# Patient Record
Sex: Female | Born: 1975 | Race: White | Hispanic: No | Marital: Married | Smoking: Current every day smoker
Health system: Southern US, Community
[De-identification: ages and names within clinical notes are randomized; demographics above are authoritative.]

## PROBLEM LIST (undated history)

## (undated) DIAGNOSIS — T4145XA Adverse effect of unspecified anesthetic, initial encounter: Secondary | ICD-10-CM

## (undated) DIAGNOSIS — Z8619 Personal history of other infectious and parasitic diseases: Secondary | ICD-10-CM

## (undated) DIAGNOSIS — T8131XA Disruption of external operation (surgical) wound, not elsewhere classified, initial encounter: Secondary | ICD-10-CM

## (undated) DIAGNOSIS — O24419 Gestational diabetes mellitus in pregnancy, unspecified control: Secondary | ICD-10-CM

## (undated) DIAGNOSIS — F439 Reaction to severe stress, unspecified: Secondary | ICD-10-CM

## (undated) DIAGNOSIS — R21 Rash and other nonspecific skin eruption: Secondary | ICD-10-CM

## (undated) DIAGNOSIS — IMO0002 Reserved for concepts with insufficient information to code with codable children: Secondary | ICD-10-CM

## (undated) DIAGNOSIS — Z658 Other specified problems related to psychosocial circumstances: Secondary | ICD-10-CM

## (undated) DIAGNOSIS — Z87891 Personal history of nicotine dependence: Secondary | ICD-10-CM

## (undated) DIAGNOSIS — Z8719 Personal history of other diseases of the digestive system: Secondary | ICD-10-CM

## (undated) DIAGNOSIS — Z87440 Personal history of urinary (tract) infections: Secondary | ICD-10-CM

## (undated) DIAGNOSIS — D649 Anemia, unspecified: Secondary | ICD-10-CM

## (undated) HISTORY — DX: Rash and other nonspecific skin eruption: R21

## (undated) HISTORY — DX: Personal history of other infectious and parasitic diseases: Z86.19

## (undated) HISTORY — DX: Reaction to severe stress, unspecified: F43.9

## (undated) HISTORY — DX: Adverse effect of unspecified anesthetic, initial encounter: T41.45XA

## (undated) HISTORY — DX: Other specified problems related to psychosocial circumstances: Z65.8

## (undated) HISTORY — DX: Personal history of urinary (tract) infections: Z87.440

## (undated) HISTORY — DX: Gestational diabetes mellitus in pregnancy, unspecified control: O24.419

## (undated) HISTORY — DX: Personal history of other diseases of the digestive system: Z87.19

## (undated) HISTORY — DX: Personal history of nicotine dependence: Z87.891

## (undated) HISTORY — DX: Reserved for concepts with insufficient information to code with codable children: IMO0002

## (undated) HISTORY — DX: Anemia, unspecified: D64.9

## (undated) HISTORY — PX: CHOLECYSTECTOMY: SHX55

## (undated) HISTORY — DX: Disruption of external operation (surgical) wound, not elsewhere classified, initial encounter: T81.31XA

---
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
## 1997-03-13 ENCOUNTER — Inpatient Hospital Stay (HOSPITAL_COMMUNITY): Admission: AD | Admit: 1997-03-13 | Discharge: 1997-03-14 | Payer: Self-pay | Admitting: Obstetrics

## 1997-03-16 ENCOUNTER — Inpatient Hospital Stay (HOSPITAL_COMMUNITY): Admission: AD | Admit: 1997-03-16 | Discharge: 1997-03-20 | Payer: Self-pay | Admitting: Obstetrics

## 1997-03-23 ENCOUNTER — Inpatient Hospital Stay (HOSPITAL_COMMUNITY): Admission: AD | Admit: 1997-03-23 | Discharge: 1997-03-23 | Payer: Self-pay | Admitting: Obstetrics

## 1997-05-21 ENCOUNTER — Emergency Department (HOSPITAL_COMMUNITY): Admission: EM | Admit: 1997-05-21 | Discharge: 1997-05-21 | Payer: Self-pay | Admitting: Emergency Medicine

## 1997-05-24 ENCOUNTER — Ambulatory Visit (HOSPITAL_COMMUNITY): Admission: RE | Admit: 1997-05-24 | Discharge: 1997-05-24 | Payer: Self-pay | Admitting: Obstetrics

## 1997-10-12 ENCOUNTER — Encounter: Payer: Self-pay | Admitting: Emergency Medicine

## 1997-10-12 ENCOUNTER — Emergency Department (HOSPITAL_COMMUNITY): Admission: EM | Admit: 1997-10-12 | Discharge: 1997-10-12 | Payer: Self-pay | Admitting: Emergency Medicine

## 2000-09-24 ENCOUNTER — Emergency Department (HOSPITAL_COMMUNITY): Admission: EM | Admit: 2000-09-24 | Discharge: 2000-09-24 | Payer: Self-pay | Admitting: Emergency Medicine

## 2001-05-09 ENCOUNTER — Other Ambulatory Visit: Admission: RE | Admit: 2001-05-09 | Discharge: 2001-05-09 | Payer: Self-pay | Admitting: Obstetrics and Gynecology

## 2001-11-21 ENCOUNTER — Inpatient Hospital Stay (HOSPITAL_COMMUNITY): Admission: AD | Admit: 2001-11-21 | Discharge: 2001-11-25 | Payer: Self-pay | Admitting: Obstetrics and Gynecology

## 2001-11-22 ENCOUNTER — Encounter (INDEPENDENT_AMBULATORY_CARE_PROVIDER_SITE_OTHER): Payer: Self-pay

## 2002-01-29 DIAGNOSIS — T8859XA Other complications of anesthesia, initial encounter: Secondary | ICD-10-CM

## 2002-01-29 HISTORY — DX: Other complications of anesthesia, initial encounter: T88.59XA

## 2002-05-11 ENCOUNTER — Other Ambulatory Visit: Admission: RE | Admit: 2002-05-11 | Discharge: 2002-05-11 | Payer: Self-pay | Admitting: Obstetrics and Gynecology

## 2002-05-29 ENCOUNTER — Encounter: Admission: RE | Admit: 2002-05-29 | Discharge: 2002-05-29 | Payer: Self-pay | Admitting: Family Medicine

## 2002-05-29 ENCOUNTER — Encounter: Payer: Self-pay | Admitting: Family Medicine

## 2004-10-17 ENCOUNTER — Other Ambulatory Visit: Admission: RE | Admit: 2004-10-17 | Discharge: 2004-10-17 | Payer: Self-pay | Admitting: Obstetrics and Gynecology

## 2004-11-29 DIAGNOSIS — R21 Rash and other nonspecific skin eruption: Secondary | ICD-10-CM

## 2004-11-29 HISTORY — DX: Rash and other nonspecific skin eruption: R21

## 2005-01-29 DIAGNOSIS — F439 Reaction to severe stress, unspecified: Secondary | ICD-10-CM

## 2005-01-29 HISTORY — DX: Reaction to severe stress, unspecified: F43.9

## 2005-10-24 ENCOUNTER — Other Ambulatory Visit: Admission: RE | Admit: 2005-10-24 | Discharge: 2005-10-24 | Payer: Self-pay | Admitting: Obstetrics and Gynecology

## 2006-01-29 DIAGNOSIS — Z8719 Personal history of other diseases of the digestive system: Secondary | ICD-10-CM

## 2006-01-29 HISTORY — DX: Personal history of other diseases of the digestive system: Z87.19

## 2006-06-06 ENCOUNTER — Encounter: Admission: RE | Admit: 2006-06-06 | Discharge: 2006-06-06 | Payer: Self-pay | Admitting: Obstetrics and Gynecology

## 2006-06-30 ENCOUNTER — Inpatient Hospital Stay (HOSPITAL_COMMUNITY): Admission: AD | Admit: 2006-06-30 | Discharge: 2006-06-30 | Payer: Self-pay | Admitting: Obstetrics and Gynecology

## 2006-07-15 ENCOUNTER — Inpatient Hospital Stay (HOSPITAL_COMMUNITY): Admission: AD | Admit: 2006-07-15 | Discharge: 2006-07-15 | Payer: Self-pay | Admitting: Obstetrics and Gynecology

## 2006-08-13 ENCOUNTER — Inpatient Hospital Stay (HOSPITAL_COMMUNITY): Admission: RE | Admit: 2006-08-13 | Discharge: 2006-08-16 | Payer: Self-pay | Admitting: Obstetrics and Gynecology

## 2006-08-13 ENCOUNTER — Encounter (INDEPENDENT_AMBULATORY_CARE_PROVIDER_SITE_OTHER): Payer: Self-pay | Admitting: Obstetrics and Gynecology

## 2007-11-11 ENCOUNTER — Encounter: Admission: RE | Admit: 2007-11-11 | Discharge: 2007-11-11 | Payer: Self-pay | Admitting: Obstetrics and Gynecology

## 2008-02-13 ENCOUNTER — Inpatient Hospital Stay (HOSPITAL_COMMUNITY): Admission: RE | Admit: 2008-02-13 | Discharge: 2008-02-15 | Payer: Self-pay | Admitting: Obstetrics and Gynecology

## 2008-02-13 ENCOUNTER — Encounter (INDEPENDENT_AMBULATORY_CARE_PROVIDER_SITE_OTHER): Payer: Self-pay | Admitting: Obstetrics and Gynecology

## 2010-05-15 LAB — BASIC METABOLIC PANEL
BUN: 7 mg/dL (ref 6–23)
CO2: 26 mEq/L (ref 19–32)
Calcium: 8.9 mg/dL (ref 8.4–10.5)
Chloride: 101 mEq/L (ref 96–112)
Creatinine, Ser: 0.46 mg/dL (ref 0.4–1.2)
GFR calc Af Amer: >60 mL/min (ref >60)
GFR calc non Af Amer: >60 mL/min (ref >60)
Glucose, Bld: 109 mg/dL — ABNORMAL HIGH (ref 70–99)
Potassium: 4 mEq/L (ref 3.5–5.1)
Sodium: 134 mEq/L — ABNORMAL LOW (ref 135–145)

## 2010-05-15 LAB — CBC
HCT: 35.7 % — ABNORMAL LOW (ref 36.0–46.0)
Hemoglobin: 11.8 g/dL — ABNORMAL LOW (ref 12.0–15.0)
Hemoglobin: 9.3 g/dL — ABNORMAL LOW (ref 12.0–15.0)
MCHC: 33 g/dL (ref 30.0–36.0)
MCV: 81.3 fL (ref 78.0–100.0)
MCV: 80.9 fL (ref 78.0–100.0)
Platelets: 225 10*3/uL (ref 150–400)
RBC: 4.39 MIL/uL (ref 3.87–5.11)
RBC: 3.41 MIL/uL — ABNORMAL LOW (ref 3.87–5.11)
RDW: 12.9 % (ref 11.5–15.5)
WBC: 11.4 10*3/uL — ABNORMAL HIGH (ref 4.0–10.5)
WBC: 11.7 10*3/uL — ABNORMAL HIGH (ref 4.0–10.5)

## 2010-05-15 LAB — RPR: RPR Ser Ql: NONREACTIVE

## 2010-05-15 LAB — GLUCOSE, CAPILLARY: Glucose-Capillary: 93 mg/dL (ref 70–99)

## 2010-06-13 NOTE — Discharge Summary (Signed)
[name], [name]           ACCOUNT NO.:  [id number]   MEDICAL RECORD NO.:  [id number]          PATIENT TYPE:  INP   LOCATION:  9128                          [facility]ILITY[facility]   PHYSICIAN:  Osborn Coho, M.D.   DATE OF BIRTH:  04-20-1975   DATE OF ADMISSION:  02/13/2008  DATE OF DISCHARGE:  02/15/2008                               DISCHARGE SUMMARY   [name] is a 35 year old G6, now P4-0-2-4, who was status post 2  days after her fourth cesarean at 53 weeks with the delivery of a fourth  daughter Miley 9 pounds 15 ounces, Apgars 9 at 1 minute and 9 at 5  minutes.   ADMISSION DIAGNOSES:  1. Spontaneous intrauterine pregnancy at 39 weeks.  2. History cesarean section x3.  3. Gestational diabetes mellitus insulin dependent.  4. Obesity.  5. Carpal tunnel syndrome.   DISCHARGE DIAGNOSES:  1. Status post term cesarean section #4 x2 days.  2. Gestational diabetes mellitus.  3. Obesity.  4. Carpal tunnel syndrome.  5. Mild anemia without symptoms.   PERTINENT LABORATORY DATA:  A+, rubella immune.  WBC 11.7 (11.4), HGB  9.3 (11.8), HCT 27.6 (35.7), PLT 177 (225), CBG on February 14, 2008, 93  mg/dL.   PROCEDURES:  1. Spinal anesthesia.  2. Repeat low transverse cesarean section.   PHYSICAL EXAMINATION:  VITAL SIGNS:  Stable.  The patient is afebrile.  LUNGS:  Clear to auscultation bilaterally.  HEART:  Rate regular rhythm.  No murmurs.  BREASTS:  Soft.  Nipples intact.  ABDOMEN:  Soft.  Fundus firm at the umbilicus, light lochia, mild  peripheral edema x4.  EXTREMITIES.  DTRs +2.  Negative Homan's and clonus x2.  The incision is  edematous around the pannus with diffuse bruising around the incision.  The wound remains well approximated and the Steri-Strips are intact.  There is no exudate.   DISCHARGE MEDICATIONS:  1. Prenatal vitamin, continue  2. Integra F b.i.d., prescription given.  3. Motrin 600 mg, prescription given.  4. Percocet 5/325, prescription  given.  5. Hydrochlorothiazide 12.5 mg 1 p.o. daily x7 days, prescription      given.   Postpartum booklet was reviewed with the patient, especially danger  signs postoperatively and postpartum.  The patient is to follow up in  the office at 6 weeks postpartum and will have a repeat glucose  challenge test after the postpartum period.  [name] is deemed to  have received full benefit of her hospitalization for the delivery of  her fourth daughter and she was offered an additional stay and declined.      Eulogio Bear, CNM      Osborn Coho, M.D.  Electronically Signed    JM/MEDQ  D:  02/15/2008  T:  02/16/2008  Job:  [id number]

## 2010-06-13 NOTE — Op Note (Signed)
NAMEGRISELA, [name]           ACCOUNT NO.:  [id number]   MEDICAL RECORD NO.:  [id number]          PATIENT TYPE:  INP   LOCATION:  9128                          [facility]   PHYSICIAN:  Osborn Coho, M.D.   DATE OF BIRTH:  05-21-1975   DATE OF PROCEDURE:  02/13/2008  DATE OF DISCHARGE:                               OPERATIVE REPORT   PREOPERATIVE DIAGNOSES:  1. A 39-plus weeks.  2. Repeat cesarean section.   POSTOPERATIVE DIAGNOSES:  1. A 39-plus weeks.  2. Repeat cesarean section.   PROCEDURE:  Repeat cesarean section.   ATTENDING DOCTOR:  Osborn Coho, MD   ASSISTANT:  Missy Sabins, CNM   ANESTHESIA:  Spinal.   Viable female infant with Apgars of 9 at one minute and 9 at five  minutes, Mylie, weighing 9 pounds 15 ounces.   SPECIMENS TO PATHOLOGY:  Placenta.   FLUIDS:  2800 mL.   URINE OUTPUT:  300 mL.   EBL:  700 mL.   COMPLICATIONS:  None.   PROCEDURE:  The patient was taken to the operating room after risks,  benefits, and alternatives were discussed with the patient.  The patient  verbalized understanding and consent signed and witnessed.  The patient  was given a spinal per Anesthesia and prepped and draped in the normal  sterile fashion in a dorsal supine position.  A Pfannenstiel skin  incision was made and carried down to the underlying layer of fascia  with a scalpel and a Bovie.  The fascia was excised bilaterally and  midline extended bilaterally with the Mayo scissors.  Kocher clamps were  placed on the inferior aspect of the fascial incision and the rectus  muscle was dissected from the fascia.  The same was done on the superior  aspect of the fascial incision.  The muscle was separated in the midline  and the peritoneum entered bluntly and extended manually.  The bladder  blade was placed and bladder flap was created with the Metzenbaum  scissors.  The uterine incision was made with a scalpel and extended  bilaterally with a bandage  scissors.  The membranes were bulging and  ruptured manually and clear fluid noted.  The infant was delivered in  the vertex presentation without any difficulty.  The cord was clamped  and cut and the infant handed to the waiting pediatricians.  The  placenta was removed and upon removal uterine everted.  The uterus was  easily replaced and at this point it was exteriorized and the placenta  was sent to Pathology.  The uterus was cleared of all clots and debris.  The uterine incision was repaired with 0 Vicryl via a running  interlocking stitch and a second imbricating layer was performed.  Normal bilateral ovaries and fallopian tubes were noted.  The intra-  abdominal cavity was copiously irrigated.  The uterus was returned to  the intra-abdominal cavity and uterine incision was noted to be  hemostatic.  The peritoneum was repaired with 2-0 chromic via a running  stitch.  The fascia was repaired with 0 Vicryl via a running stitch.  The subcutaneous tissue was  reapproximated using 3 interrupted stitches  of 2-0 plain.  The skin was reapproximated using 3-0 Monocryl.  Half-  inch Steri-Strips were applied with Benzoin.  Dressing was applied as  well.  Sponge, lap, and needle count was correct.  The patient tolerated  the procedure well and is currently being transferred to the recovery  room in good condition.      Osborn Coho, M.D.  Electronically Signed     AR/MEDQ  D:  02/13/2008  T:  02/14/2008  Job:  [id number]

## 2010-06-13 NOTE — H&P (Signed)
[name], [name]           ACCOUNT NO.:  [id number]   MEDICAL RECORD NO.:  [id number]          PATIENT TYPE:  MAT   LOCATION:  MATC                          [facility]   PHYSICIAN:  Osborn Coho, M.D.   DATE OF BIRTH:  March 23, 1975   DATE OF ADMISSION:  02/13/2008  DATE OF DISCHARGE:                              HISTORY & PHYSICAL   Ms. Ossa is a 35 year old.  She is a married white female gravida 6,  para 3-0-2-3 who presents on the day of admission for a scheduled repeat  low transverse cesarean section.  She is 39-2/7 weeks per an [facility] of  February 18, 2008.  She is without complaints this morning with the  exception of some slight nausea and a little bit of dry heaves when she  was brushing her teeth this morning.  She, otherwise, feels good.  Good  fetal movements with some occasional contractions.  She denies any PIH  or UTI signs or symptoms, no fever, cough or shortness of breath.  She  has been followed by Dr. Su Hilt at [facility].  Her history has been  remarkable for:   1. Gestational diabetes, insulin-dependency.  She regularly takes      Humulin 8 units every morning subcu, and then has 22 units subcu at      night.  Per anesthesia she took 11 units last night since she is      going to be n.p.o., and she has not had any this morning.  2. Obesity.  3. Previous C-section x3 with desire for repeat.  Her group beta strep      is negative.  Other medications in addition to her insulin is just      a prenatal vitamin.   ALLERGIES:  She denies medication or latex allergies and no other  sensitivities.   MENSTRUAL HISTORY:  She reports menarche at age 14, monthly cycles.  Reports no abnormalities, but had an uncertain LMP.  She said it was the  1st of April so her [facility] of January 20th was based on an ultrasound at 12  weeks.   OBSTETRICAL HISTORY:  Rhett Bannister 1 was a C-section for chorioamnionitis 42  weeks' gestation.  That was in February of 1999.  It was a  female.  Weighed 8 pounds 1 ounce after about 16 hours of labor.  She did have an  epidural, and that was by Dr. Gaynell Face. Rhett Bannister 2 was an elective  abortion around 6 weeks, and that was May 2001.  Gravida 3 was a failed  VBAC leading to a repeat C-section at 41 weeks' gestation.  A female in  October of 2003 weighing 8 pounds 10 ounces after about 16 hours of  labor as well.  She did have an epidural, and that was by Larene Beach, Dr.  Su Hilt, and Dr. Stefano Gaul.  Gravida 4 was a spontaneous abortion in  2006.  Gravida 5 was a repeat C-section at 57 and 2, a female July 2008  weighing 8 pounds 5 ounces; spinal that was by Dr. Su Hilt and Larene Beach,  and she did have gestational diabetes requiring insulin. Gravida 6  is  current pregnancy.   PAST MEDICAL HISTORY:  She has a history of anemia.  She has used Depo  in the past for birth control.  Her last shot was in November of 2008.  She did have varicella as a child, normal childhood illnesses.  Treated  for chlamydia in the 1990s.   SURGICAL HISTORY:  Remarkable for the C-section x3, and she has had a  cholecystectomy in 2004.   FAMILY HISTORY:  Maternal grandmother with heart disease as well as high  blood pressure and stroke.  Maternal grandmother COPD and an uncle with  lung cancer.   GENETIC HISTORY:  Unremarkable.   SOCIAL HISTORY:  She is a married white female.  She reports [employer].  The father of the baby's name is Musa Cotta.  The patient has  a GED.  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  She denied alcohol,  tobacco or illicit drug use.   HISTORY OF PRESENT PREGNANCY:  Prenatal labs:  Blood type is A+, Rh  antibody screen negative, RPR nonreactive, rubella titer immune.  Hepatitis surface antigen negative, HIV nonreactive.  Hemoglobin 12,  hematocrit was 36.3, and this was all July 6th.  Platelets 288.  Her Pap  in September of 2008 was within  normal limits.  Gonorrhea and Chlamydia  cultures negative.  Cystic fibrosis screen in January of 2008 was  negative.  She entered care July the 6th with unsure dates.  Had an  ultrasound for dating around 12 weeks.  She reported a pregravid weight  of 185.  She is 5 feet 6 inches.  At her new OB workup  she did plan  care with Dr. Su Hilt, and did plan for a repeat C-section.  Her weight  at that time was 202.  She did return at 13 weeks.  Was spilling some  glucose 2+; her dextrose sugar was 144.  She had eaten about 2-3 hours  before that, had some peach cobbler and sweet tea.  Had her 1st  trimester screen done that day.  Nuchal translucency was 1.85 mm.  Nasal  bone was seen.  That first trimester screen was negative.  Did have  bilateral carpal tunnel syndrome during the pregnancy.  She did  initially have some desire for bilateral tubal ligation, but this  morning does not want to get her tubes tied per her verbalization.  Did  have AFP done at 16-1/7 weeks, and it was also within normal limits.  She had her anatomy ultrasound at 18-5/7 weeks.  Single intrauterine  pregnancy size was equal to previous dating.  Cervix was 4.9 cm.  Posterior placenta was normal, slightly low lying, and AFI was within  normal limits.  Anatomy and growth and development were normal;  suggested another female.  By the 22-week mark she had gained  approximately 24 pounds, 10 pounds the previous months, and plan was for  1-hour GTT.  It was abnormal.  She then went on to have 3-hour GTT, and  3/4 were abnormal values and was diagnosed with gestational diabetes.  She was referred to [facility] for insulin teaching and her finger sticks at that time.  She did  have to have some cortisone shots during the pregnancy for her hands.  The patient did receive her flu shot.  She had an ultrasound at 28  weeks.  She was measuring greater than dates.  She was measuring  at  fundal height is 32.  Ultrasound showed an estimated fetal weight in the  80th percentile at that time.  Normal AFI and placenta were no longer  low-lying, and the cervix was 4.5 cm.  During the pregnancy initially  she was placed on NPH, and eventually switched to the Humulin.  Did have  some complaints of GERD.  She had an ultrasound at 32 in size, and  baby's weight was measuring greater than 90th percentile, normal fluid,  posterior placenta.  She was started on antenatal testing around 32  weeks, and pregnancy continued to progress without any other significant  complications until her presentation today for her repeat C-section.   OBJECTIVE:  VITAL SIGNS:  She is afebrile, and vital signs are stable on  admission.  GENERAL:  She is alert and oriented x3.  No acute distress.  HEENT:  Grossly intact and within normal limits.  She denies having  glasses or contacts.  CARDIOVASCULAR:  Regular rate and rhythm without murmur.  LUNGS:  Clear to auscultation bilaterally.  ABDOMEN:  Soft, nontender, and gravid.  PELVIC:  Deferred.  EXTREMITIES:  Without edema and no clonus, DTRs are 2+.   IMPRESSION:  1. Intrauterine pregnancy at 39-2/7 weeks.  2. Previous cesarean section x3 with desire for repeat.  3. Gestational diabetes insulin-dependent.  4. Group B streptococcus is negative.  5. Obesity.   PLAN:  1. She was admitted to short stay with routine preoperative orders.  2. Dr. Su Hilt will be by to speak with patient before surgery, and      will defer any questions to her and plan admission under Dr.      Su Hilt.      Candice [place], [place]      Osborn Coho, M.D.  Electronically Signed    CHS/MEDQ  D:  02/13/2008  T:  02/13/2008  Job:  [id number]

## 2010-06-16 NOTE — H&P (Signed)
[name], [name]           ACCOUNT NO.:  [id number]   MEDICAL RECORD NO.:  [id number]          PATIENT TYPE:  INP   LOCATION:  NA                            [facility]   PHYSICIAN:  Osborn Coho, M.D.   DATE OF BIRTH:  [birth year]   DATE OF ADMISSION:  08/13/2006  DATE OF DISCHARGE:                              HISTORY & PHYSICAL   [name] is a 35 year old gravida 5, para 2-0-1-2   Dictation ended at this point.      Renaldo Reel Emilee Hero, C.N.M.      Osborn Coho, M.D.     VLL/MEDQ  D:  08/12/2006  T:  08/12/2006  Job:  [id number]

## 2010-06-16 NOTE — Discharge Summary (Signed)
NAMEANNALEI, [name]           ACCOUNT NO.:  [id number]   MEDICAL RECORD NO.:  [id number]          PATIENT TYPE:  MAT   LOCATION:  MATC                          [facility]   PHYSICIAN:  Janine Limbo, M.D.DATE OF BIRTH:  1975/07/12   DATE OF ADMISSION:  08/13/2006  DATE OF DISCHARGE:  08/16/2006                               DISCHARGE SUMMARY   ADMITTING DIAGNOSES:  1. Intrauterine pregnancy at 39 weeks.  2. Previous cesarean section x2 with desire for repeat.  3. Declines tubal.  4. Gestational diabetes on 4 units of insulin.   DISCHARGE DIAGNOSES:  1. Intrauterine pregnancy at 39 weeks.  2. Previous cesarean section x2 with desire for repeat.  3. Declines tubal.  4. Gestational diabetes on 4 units of insulin.  5. Uterine inversion with replacement.   PROCEDURES:  1. Repeat low transverse cesarean section.  2. Spinal anesthesia.   HOSPITAL COURSE:  [name] is a 35 year old gravida 5, para 2-0-2-2  at 25 weeks who presented for scheduled repeat cesarean section on August 13, 2006.  Her history had been remarkable for:  1. Previous C-section x2 with desire for repeat.  2. History of wound dehiscence after one of her last C-section.  3. Needle stick in December 30, 2005.  4. Gestational diabetes managed on 4 units of insulin at bedtime.   The patient was taken to the operating room by Dr. Osborn Coho where  a repeat low transverse cesarean section was performed under spinal  anesthesia.  Findings were a viable female, weight 8 pounds 5 ounces.  Apgars were 9 and 9.  Complicating factors were a uterine inversion with  a replacement manually.  Placenta was sent to pathology.  The patient  tolerated the procedure well was taken to recovery in good condition.  Infant was taken to the full-term nursery.  By postop day #1, patient  was doing well.  Her incision was clean, dry and intact.  Her JP drain  was draining serosanguineous fluid.  Her hemoglobin was 9.8  down from  12.8 and hematocrit 28.9, white blood cell count 12.5 and platelet count  199.  Lactation consultant saw the patient for sore nipples.  Rest of  the patient's hospital course was uncomplicated.  By postop day #3, she  was doing well.  She was pumping for breast milk.  She had removal of  her JP drain without difficulty.  Her lochia was scant.  Her incision  was clean, dry and intact.  She was tolerating a regular diet and was  taking p.o. pain medication with benefit.  She was deemed to receive  full benefit of hospital stay and was discharged home.   DISCHARGE CONDITION:  Stable.   DISCHARGE INSTRUCTIONS:  Per [facility] handout.   DISCHARGE MEDICATIONS:  1. Motrin 600 mg p.o. q.6 h p.r.n. pain.  2. Percocet 5/325 one to two p.o. daily first p.r.n. pain.   The patient was currently undecided about her birth control.      Renaldo Reel Emilee Hero, C.N.M.      Janine Limbo, M.D.  Electronically Signed  VLL/MEDQ  D:  08/16/2006  T:  08/16/2006  Job:  [id number]

## 2010-06-16 NOTE — H&P (Signed)
[name], [name]                       ACCOUNT NO.:  [id number]   MEDICAL RECORD NO.:  [id number]                   PATIENT TYPE:  INP   LOCATION:  9166                                 [facility]   PHYSICIAN:  Osborn Coho, M.D.                DATE OF BIRTH:  February 22, 1975   DATE OF ADMISSION:  11/21/2001  DATE OF DISCHARGE:                                HISTORY & PHYSICAL   HISTORY OF PRESENT ILLNESS:  The patient is a 35 year old married white  female gravida 3, para 1-0-1-1 at 40-5/7 weeks who presents complaining of  uterine contractions every 3-4 minutes for the last 1-1/2 hours.  She has  seen some bloody show.  She denies any leaking of fluid.  She denies any  headache, nausea, vomiting, or visual disturbances.  She reports positive  fetal movement.  Her pregnancy has been followed at [facility]  by the certified nurse midwife service and has been at risk for a history of  previous low transverse cesarean section for nonreassuring fetal heart rate  and maternal fever and she is desiring a vaginal birth after cesarean  section attempt.  Her second complication with pregnancy was unknown LMP and  third was history of wound dehiscence following last delivery.  Her group B  strep is negative.   OBSTETRICAL-GYNECOLOGICAL HISTORY:  She is a gravida 3, para 1-0-1-1.  She  had a C-section delivery in February 1999 of a viable female infant who  weighed 8 pounds 1 ounce at [redacted] weeks gestation following a 10-hour labor.  She progressed to 7 cm but developed a fever and the fetal heart rate was  nonreassuring at which point she was recommended to proceed with a cesarean  section and underwent the same.  It is reported and documented to be a low  transverse cesarean section.  In May 2001 she had an elective AB with no  complications.  She was told that she was a borderline gestational diabetic  with her last pregnancy but never officially diagnosed.  She was  treated for  gonorrhea in 1996.   GENERAL MEDICAL HISTORY:  She has no known drug allergies.  She reports  having had the usual childhood diseases.  She has actually a pretty benign  medical history.   PAST SURGICAL HISTORY:  Her only surgical history was for elective AB in  2001 and the C-section in 1999.   FAMILY HISTORY:  Father and mother with hypertension.  Mother with a heart  murmur.  Paternal uncle with lung cancer.  Mother with depression.   GENETIC HISTORY:  Negative.   SOCIAL HISTORY:  She is married to [employer].  She is of the [employer].  She denies any illicit drug use, alcohol, or smoking with this  pregnancy.  She quit smoking as soon as she knew she was pregnant.   PRENATAL LABORATORIES:  Her blood type is A  positive, her antibody screen is  negative, syphilis is nonreactive, rubella is immune, hepatitis B surface  antigen is negative, HIV is declined, GC and Chlamydia are both negative,  Pap smear is within normal limits, 1-hour glucola was 105, maternal serum  alpha-fetoprotein was within normal range and her 36-week beta strep was  negative.   PHYSICAL EXAMINATION:  VITAL SIGNS: Stable.  She is afebrile.  HEENT: Grossly within normal limits.  HEART: Regular rhythm and rate.  CHEST: Clear.  BREASTS: Soft and nontender.  ABDOMEN: Gravid with uterine contractions every 3-4 minutes for 30-40  seconds, fetal heart rate is with a baseline of around 160, she did have a  10-beat per minute accel with scalp stimulation and has had mild variables.  CERVIX: Cervix is 3 cm, 90%, vertex, -1 with bulging membranes.  EXTREMITIES: Within normal limits.   ASSESSMENT:  1. Intrauterine pregnancy at 40-5/7 weeks.  2. Early labor.  3. Previous low transverse cesarean section desiring vaginal birth after     cesarean and signing the vaginal birth after cesarean consent form.  4. Negative group B streptococci.   PLAN:  Admit to labor and delivery to follow routine  CNM orders.  Dr.  Su Hilt has been notified of the patient's admission.     Concha Pyo. [place], C.N.M.              Osborn Coho, M.D.    SJD/MEDQ  D:  11/21/2001  T:  11/21/2001  Job:  [id number]

## 2010-06-16 NOTE — H&P (Signed)
[name], [name]           ACCOUNT NO.:  [id number]   MEDICAL RECORD NO.:  [id number]          PATIENT TYPE:  INP   LOCATION:  NA                            [facility]   PHYSICIAN:  Osborn Coho, M.D.   DATE OF BIRTH:  03-07-1975   DATE OF ADMISSION:  08/13/2006  DATE OF DISCHARGE:                              HISTORY & PHYSICAL   HISTORY OF PRESENT ILLNESS:  Ms. Coverdale is a 35 year old gravida 5,  para 2-0-2-2, at 39 weeks, who presents for scheduled repeat cesarean  section.   PAST MEDICAL HISTORY:  1. Her history is remarkable for previous C-section x2.  2. History of wound dehiscence after one of her last C-sections.  3. Needle stick in December 2007.  4. Gestational diabetes.   PRENATAL LABS:  Blood type is A positive, RH antibody negative, VDRL  nonreactive, Rubella titer positive, hepatitis B surface antigen  negative.  HIV nonreactive.  GC and Chlamydia cultures were negative in  September as well as at 35 weeks.  Cystic fibrosis testing was negative.  Pap was normal also in September.  Hemoglobin upon entering the practice  was 12.7.  It was normal at 28 weeks.  The patient had normal first  trimester screening.  She had a normal AFP.  She had an elevated 1-hour  GTT, Glucola at 140, and an abnormal 3-hour GTT.  Group B strep culture  and other cultures were negative at 36 weeks.   HISTORY OF PRESENT PREGNANCY:  The patient entered care at approximately  12 weeks.  She planned a repeat C-section with Dr. Su Hilt.  She had a  normal first trimester screen and a normal AFP.  She had an ultrasound  at 18 weeks.  She also had had a first trimester ultrasound for dating.  She had another ultrasound at 18 weeks showing normal growth.  She was  tearful at an office visit at 21-22 weeks but she did not share the  issue there.  She had a Glucola of 140 and then an abnormal 3-hour GTT  for the diagnosis of gestational diabetes.  Initially, sugars were  fairly  normal, however, by 30-31 weeks, she was having some slight  elevations in both fasting and 2 hours.  She then was started on 4 units  of NPH at bedtime.  She began to see [place] twice weekly at 32 weeks.  There  were some issues with compliance.  She had an ultrasound at 33 weeks  showing growth at the 74th percentile, normal fluid, a BPP of 6 out of  8.  NSTs were reassuring but nonreactive.  She was seen at maternity  admissions unit several times through the course of her pregnancy for  various issues.  NSTs were continued.  She was then scheduled for C-  section on August 13, 2006.   OBSTETRICAL HISTORY:  In 1998, she had a primary low transverse cesarean  section for a female infant, weight 8 pounds 1 ounce, at 42 weeks.  She  was in labor 16 hours.  She had epidural anesthesia.  She had a C-  section for chorioamnionitis.  In 2001, she had a 6-week termination.  In 2003, she had a repeat low transverse cesarean section for failed  VBAC.  She had a female infant, weight 8 pounds, 10 ounces at 41 weeks.  She was in labor 16 hours.  She had epidural anesthesia.  She did have  anemia during that pregnancy.  She also had wound dehiscence following  that delivery.   PAST MEDICAL HISTORY:  She is a previous Depo user.  She had gonorrhea  in 1993.  Reports usual childhood illnesses.  She was stuck by a dirty  needle while caring for an elderly diabetic female on December 30, 2005.  She has a history of anemia.   PAST SURGICAL HISTORY:  Includes the previously noted C-section x2 and a  cholecystectomy in 2004 or 2005.   ALLERGIES:  She has no known medication allergies.   FAMILY HISTORY:  Maternal grandmother had hypertension, heart disease,  COPD, diabetes, and a stroke.  Her uncle had lung cancer.  There is a  family history of smoking.   GENETIC HISTORY:  Unremarkable.   SOCIAL HISTORY:  The patient is married to the father of the baby.  He  is involved and supportive.  His name is  Catering manager.  The patient  has her GED.  She is employed as a Education administrator.  Her  husband had a high school education.  He is employed as a Naval architect.  She has been followed by the physician service __________ [facility].  She denies any alcohol, drug, or tobacco use during this pregnancy.   PHYSICAL EXAMINATION:  VITAL SIGNS:  Stable.  The patient is afebrile.  HEENT:  Within normal limits.  LUNGS:  Breath sounds are clear.  HEART:  Regular rate and rhythm without murmur.  BREASTS:  Soft and nontender.  ABDOMEN:  Fundal height is approximately 38 cm.  Estimated fetal weight  is 8 to 8-1/2 pounds.  Uterine contractions are occasionally mild per  the patient's report.  PELVIC:  Exam is deferred.  EXTREMITIES:  Deep tendon reflexes are 2+ without clonus.  There is  trace edema noted.   IMPRESSION:  1. Intrauterine pregnancy at 39 weeks.  2. Previous cesarean section times 2 with desire for repeat.  3. Declines tubal.  4. Gestational diabetes on 4 units of insulin nightly.   PLAN:  1. Admit to the [facility] for consult with Dr.      Osborn Coho, the attending physician.  2. Routine physician preoperative orders.  3. Dr. Su Hilt will define plan for monitoring of her diabetes.      Renaldo Reel Emilee Hero, C.N.M.      Osborn Coho, M.D.  Electronically Signed    VLL/MEDQ  D:  08/12/2006  T:  08/12/2006  Job:  [id number]

## 2010-06-16 NOTE — Discharge Summary (Signed)
[name], [name]                       ACCOUNT NO.:  [id number]   MEDICAL RECORD NO.:  [id number]                   PATIENT TYPE:  INP   LOCATION:  9145                                 [facility]ILITY:  [facility]   PHYSICIAN:  Naima A. Dillard, M.D.              DATE OF BIRTH:  Mar 08, 1975   DATE OF ADMISSION:  11/21/2001  DATE OF DISCHARGE:  11/25/2001                                 DISCHARGE SUMMARY   ADMITTING DIAGNOSES:  1. Intrauterine pregnancy at 40 and 5/7 weeks.  2. Early labor.  3. Previous low transverse cesarean section desiring vaginal birth after     cesarean.   DISCHARGE DIAGNOSES:  1. Term intrauterine pregnancy.  2. Vaginal birth after cesarean attempt, but unsuccessful.  3. Chorioamnionitis.  4. Nonreassuring fetal heart rate.  5. History of previous wound dehiscence with last pregnancy.   HOSPITAL COURSE:  The patient is a 35 year old gravida 3, para 1-0-1-1 at 12  and 5/7 weeks who was admitted in early labor on November 21, 2001.  Her  history had been remarkable for previous low transverse cesarean section for  nonreassuring fetal heart rate, maternal fever with a desire for a vaginal  birth, questionable LMP, prior history of wound dehiscence following her  last delivery.  On admission she was 3 cm, 90%, vertex at a -1 station.  She  was admitted for labor care.  Artificial rupture of membranes was  accomplished soon after admission and she had essentially clear fluid but  there were some small fragments of meconium.  Fetal heart rate was  reassuring.  She was placed on penicillin for elevated temperature.  Early  on the morning of October 25 she began to have episodes of tachycardia.  Cervix was 6 with edematous anterior lip.  Vertex was at a 0/+1 station.  The patient had had an epidural placed.  Fetal heart rate tended to show  some decelerations when the patient was on the left side, but was reassuring  when she was on the right.  Through the rest of early  morning Pitocin was  given in an attempt to accomplish adequate labor.  By 6 a.m. on the morning  of October 25 she was 8 cm, vertex at +1 station.  Fetal heart was in the  160s-170s.  Temperature was 101.2.  Temperature one hour later was 101.9  with a fetal heart rate increasing in tachycardia to 170-175 and minimal  variability.  WBC count had been noted to be 28.5 on admission.  The patient  had been placed on ampicillin and gentamicin prior to that time.  She was  given Tylenol.  Over the next 30 minutes the fetal heart rate began to show  increasing tachycardia to 195.  Cervix was approximately 9 cm with an  asynclitic presentation.  There was no benefit to her temperature from  several doses of Tylenol.  Therefore, the patient was consented for a repeat  cesarean section secondary to nonreassuring fetal heart rate and  chorioamnionitis.  The patient was taken to the operating room where Dr.  Osborn Coho and Dr. Marline Backbone performed a low transverse cesarean  section under epidural anesthesia.  Findings were a viable female with  Apgars of 4, 8, and 9.  Cord pH was 7.16.  The infant was taken to the full-  term nursery.  Mother was taken to recovery room in good condition.  She did  have mild temperature post delivery, but then defervesced over the next  several hours.  She was planning Depo Provera.  Her hemoglobin on day one  was 9.3, WBC count 25.2 down from 28.5.  Antibiotics were continued with  gentamicin and clindamycin until she was afebrile for 24 hours.  Lactation  was consulted to assist the patient in her breast-feeding attempts.  Her WBC  count on day two was 15.1 and she had been afebrile for 24 hours.  By  postoperative day three patient was up at lib.  She was having some general  incisional soreness, but overall was doing well.  She was afebrile.  Her  incision was clean, dry, and intact.  Physical examination was within normal  limits.  Her consultation with  Dr. Dois Davenport Rivard on day two the decision was  made to maintain her staples until approximately six days postoperative.  The patient was understanding of this plan and was discharged home.   DISCHARGE INSTRUCTIONS:  Per [facility] handout.   DISCHARGE MEDICATIONS:  1. Motrin 600 mg p.o. q.6h. p.r.n. pain.  2. Tylox one to two p.o. q.3-4h. p.r.n. pain.   DISCHARGE FOLLOWUP:  Will occur on November 29, 2001 for staple removal at  2:30 and then at six weeks postpartum.  Wound care precautions were reviewed  again with the patient in light of her previous history of wound dehiscence.  The patient is to call with any concerns.     [name], C.N.M.                   Naima A. Normand Sloop, M.D.    [name]  D:  11/25/2001  T:  11/26/2001  Job:  [id number]

## 2010-06-16 NOTE — Op Note (Signed)
NAMEBRAXTYN, DORFF                       ACCOUNT NO.:  [id number]   MEDICAL RECORD NO.:  [id number]                   PATIENT TYPE:  INP   LOCATION:  9166                                 [facility]   PHYSICIAN:  Osborn Coho, M.D.                DATE OF BIRTH:  1975/04/29   DATE OF PROCEDURE:  11/22/2001  DATE OF DISCHARGE:                                 OPERATIVE REPORT   PREOPERATIVE DIAGNOSES:  1. Term intrauterine pregnancy.  2. Vaginal birth after cesarean (VBAC).  3. Chorioamnionitis.  4. Nonreassuring fetal heart tracing.   POSTOPERATIVE DIAGNOSES:  1. Term intrauterine pregnancy.  2. Vaginal birth after cesarean (VBAC).  3. Chorioamnionitis.  4. Nonreassuring fetal heart tracing.   PROCEDURE:  Repeat low transverse cesarean section.   SURGEON:  Osborn Coho, M.D.   ASSISTANT:  Janine Limbo, M.D.   ANESTHESIA:  Epidural.   FLUIDS REPLACED:  2000 cc.   URINE OUTPUT:  200 cc.   ESTIMATED BLOOD LOSS:  500 cc.   COMPLICATIONS:  None.   FINDINGS:  Live female infant with Apgars of 4 at one minute, 8 at five  minutes, and 9 at 10 minutes.  Normal bilateral ovaries and tubes.  Cord pH  7.16.  Placenta sent to pathology.   DESCRIPTION OF PROCEDURE:  The patient was taken to the operating room after  the risks, benefits, and alternatives of a C-section were discussed with the  patient and partner.  The patient verbalized understanding and consent for C-  section signed and witnessed.  The patient was given epidural bolus to  acquire a surgical level.  The patient was prepped and draped in the normal  sterile fashion.  A Pfannenstiel skin incision was made at the site of the  prior C-section scar and was carried down to the underlying layer of fascia  with the knife.  The remaining portion of the fascia was bluntly dissected.  The muscle was separated in the midline and the peritoneum entered bluntly.  Bladder flap created with the knife and  bladder blade used to protect the  bladder.  A uterine incision was made and extended manually.  The infant was  in the vertex presentation and delivered without difficulty.  The infant's  nasopharynx and oropharynx were bulb-suctioned and then immediately handed  off to the awaiting pediatricians.  Cord bloods were sent.  A cord gas was  sent as well.  The placenta was manually removed and sent to pathology.  The  uterine incision was repaired with 0 Vicryl in a running locked fashion.  A  second imbricating layer was performed.  The fascia was repaired with 0  Vicryl after the gutters were cleared of all clots and debris and normal  bilateral ovaries and tubes noted.  The subcutaneous layer was closed with 3-  0 plain using interrupteds.  The skin was closed with staples.  The patient  tolerated  the procedure well and was returned to the recovery room in stable  condition.                                               Osborn Coho, M.D.    AR/MEDQ  D:  11/22/2001  T:  11/24/2001  Job:  [id number]

## 2010-06-16 NOTE — Op Note (Signed)
[name], [name]           ACCOUNT NO.:  [id number]   MEDICAL RECORD NO.:  [id number]          PATIENT TYPE:  INP   LOCATION:  9120                          [facility]   PHYSICIAN:  Osborn Coho, M.D.   DATE OF BIRTH:  November 10, 1975   DATE OF PROCEDURE:  08/13/2006  DATE OF DISCHARGE:                               OPERATIVE REPORT   PREOPERATIVE DIAGNOSIS:  1. 39 weeks.  2. Repeat C-section.   POSTOPERATIVE DIAGNOSIS:  1. 39 weeks.  2. Repeat C-section.  3. Uterine inversion with replacement.   PROCEDURE:  Repeat C-section.   ATTENDING:  Osborn Coho, M.D.   INTRAOPERATIVE CONSULTS:  Maxie Better, M.D.   ASSISTANT:  Renaldo Reel. Emilee Hero, C.N.M.   ANESTHESIA:  Spinal.   SPECIMEN TO PATHOLOGY:  Placenta.   FLUIDS:  3050 mL   ESTIMATED BLOOD LOSS:  800 mL   URINE OUTPUT:  125 mL   COMPLICATIONS:  None.   FINDINGS:  Live female infant with Apgars of 9 at 1 minute and 9 at 5  minutes.  Weight 8 pounds 5 ounces.  Name Keely, normal-appearing  bilateral ovaries and fallopian tubes.   PROCEDURE:  The patient was taken to the operating room after risks,  benefits and alternatives reviewed with the patient.  The patient  verbalized understanding consent signed and witnessed.  The patient was  given a spinal per anesthesia and prepped and draped in normal sterile  fashion.  A Pfannenstiel skin incision was made at the site of prior  scar and carried down to the underlying layer of fascia with the  scalpel.  The fascia was excised bilaterally in the midline with the  scalpel and extended bilaterally with the Mayo scissors.  Kocher clamps  were placed on the inferior aspect of fascial incision and the rectus  muscle excised from the fascia.  The same was done on the superior  aspect of fascial incision.  The muscle separated in midline and there  was some bleeding at the site of the muscle.  This was made hemostatic.  The peritoneum was identified and entered  bluntly and extended manually.  Multiple omental adhesions were noted and were excised by placing two  Kelly clamps excising and tying with 0 Vicryl ties.  Good hemostasis was  noted.  The area at the muscle was bleeding as well was sutured with 3-0  Vicryl and hemostasis noted.  The uterine incision was made and extended  bilaterally with the bandage scissors.  Attempt was made to create a  bladder flap however bladder was adherent to the lower uterine segment  and decision was made to go above this area where the uterus was still  thin and it was in the lower uterine segment as well.  Clear fluid was  noted and the infant was delivered in vertex presentation and oropharynx  and nasopharynx were bulb suctioned.  Cord clamped and cut and infant  handed to the waiting pediatricians.  Placenta was attempted to be  removed via fundal massage.  However, uterus inverted and initially  there was a questionable accreta however once the Pitocin was stopped  and the dose of nitroglycerin given, uterus was replaced in its normal  anatomic position and at that time the placenta detached without  difficulty.  Pitocin was then begun while uterus was inverted and no  bleeding was noted.  An intraoperative consult was obtained by Dr.  Cherly Hensen who helped with the replacement of the uterus to its anatomical  position.  The uterine incision was repaired with 0 Vicryl in a running  locked fashion after clearing the uterus of all clots and debris.  A  second imbricating layer was performed on the uterus with 0 Vicryl.  The  uterus had been exteriorized and was now returned to the intra-abdominal  cavity which was copiously and irrigated.  With good hemostasis of the  uterine incision and the peritoneum was repaired with 2-0 chromic in a  running fashion.  The fascia was repaired with 0 Vicryl in a running  fashion.  The subcutaneous tissue was irrigated and made hemostatic with  the Bovie.  The size 10 JP  drain was placed and the subcutaneous tissue  was reapproximated using 2-0 plain via three interrupted stitches.  The  skin was reapproximated using 3-0 Monocryl via a subcuticular stitch.  Half inch Steri-Strips were applied with Benzoin and the incision  dressed.  Sponge, lap and needle count was correct.  The patient  tolerated procedure well and was returned to recovery room in good  condition.      Osborn Coho, M.D.  Electronically Signed     AR/MEDQ  D:  08/13/2006  T:  08/13/2006  Job:  [id number]

## 2010-06-16 NOTE — H&P (Signed)
NAMEJOANNAH, Mendoza           ACCOUNT NO.:  [id number]   MEDICAL RECORD NO.:  [id number]          PATIENT TYPE:  INP   LOCATION:                                [facility]   PHYSICIAN:  Osborn Coho, M.D.   DATE OF BIRTH:  06-25-1975   DATE OF ADMISSION:  08/13/2006  DATE OF DISCHARGE:                              HISTORY & PHYSICAL   [name] is a 34 year old gravida 5, para 2-0-2-2, at 39-2/7th's  weeks who presents for scheduled repeat cesarean section.   HISTORY:  1. C-section x2 with desire for repeat.  2. History of wound dehiscence with previous incision.  3. Needle stick in December 2007.  4. Gestational diabetes on insulin.   PRENATAL LABORATORY:  Blood type is A positive.  Rh antibody negative.  VDRL nonreactive.  Rubella titer positive.  Hepatitis B surface antigen  negative.  HIV nonreactive.  Cystic fibrosis testing was negative.  GC  and Chlamydia cultures were negative in September 2007.  Pap was normal  in September 2007.  Hemoglobin upon entry into practice was 12.7.  It  was within normal limits at 28 weeks.  First trimester screen was  normal.  AFP was normal.  One-hour Glucola was elevated at 140.  Three-  hour GTT was also abnormal.  Fasting remained 95-104.  Two-hour post  prandials were elevated.  She was begun on insulin at approximately 31  weeks.  Group B strep culture was negative at 36 weeks.   HISTORY OF PRESENT PREGNANCY:  The patient entered care at approximately  12 weeks.  She planned a repeat C-section at that time.  First trimester  screening was desired.  And, her quadruple screen was normal.  She had  an ultrasound at 18 weeks showing normal growth.  She was tearful at 21  weeks but did not share what the issue was.  She was clear in her  pregnancy early that she did not want a tubal ligation.  She had an  elevated 1-hour Glucola at 140, 3-hour GTT was also abnormal.  Her  fasting levels were slightly elevated and her 2-hour PCs  were slightly  elevated.  By 31 weeks she was started on 4 units NPH at bedtime.  NSTs  twice weekly were begun at 31-32 weeks.  She had another ultrasound at  33 weeks showing growth at the 74th percentile with a normal BPP and  normal fluid.  She was having difficulty following dietary guidelines.  She has been seen several times in maternity admissions for various  issues.  She has had twice weekly NSTs and these have been reactive.  Group B strep culture and other GC and Chlamydia cultures were negative  at 35 weeks.   OBSTETRICAL HISTORY:  1. In 1999, she had a primary low-transverse cesarean section at 42      weeks for a female infant at 8 pounds 1 ounce.  She was in labor 16      hours.  She had chorioamnionitis.  2. In 2001, she had a 6-week termination.  3. In 2003, she had a failed VBAC for a  female infant, weight 8 pounds      10 ounces at 41 weeks.  She was in labor 16 hours with epidural      anesthesia.  She did have an issue with the wound subsequent to      that.  4. In 2006, she had a miscarriage.  She does have a history of anemia.   MEDICAL HISTORY:  1. She is a previous Depo-Provera user.  2. In 1990, she did have gonorrhea.  3. She reports the usual childhood illnesses.  4. She was stuck by a dirty needle in late 2007.  5. She does have a history of anemia.   SURGICAL HISTORY:  1. C-section x2.  2. Cholecystectomy in the past.   ALLERGIES:  None.   FAMILY HISTORY:  Her maternal grandmother has heart disease.  Her  maternal grandmother had hypertension.  Maternal grandmother also had  COPD and was a diabetic.  Maternal grandmother also had a stroke.  Uncle  had lung cancer.  There is a strong family history of smoking.   GENETIC HISTORY:  Unremarkable.   SOCIAL HISTORY:  The patient is married to the father of the baby.  He  is involved and supportive.  His name is Lobbyist.  The patient  is Caucasian.  She is of the [employer].  She has her  GED.  She is  employed as a Education administrator.  Her husband has a high  school education.  He is a Naval architect.  She has been followed by the  Physician Service at [facility].  She denies any alcohol, drug,  or tobacco use in this pregnancy.   PHYSICAL EXAMINATION:  VITAL SIGNS:  Stable.  The patient is afebrile.  HEENT:  Within normal limits.  LUNGS:  Breath sounds are clear.  HEART:  Regular rate and rhythm without murmur.  BREAST:  Soft and nontender.   Dictation ended at this point.      Renaldo Reel Emilee Hero, C.N.M.      Osborn Coho, M.D.  Electronically Signed    VLL/MEDQ  D:  08/12/2006  T:  08/12/2006  Job:  [id number]

## 2010-11-13 LAB — CBC
Hemoglobin: 9.8 — ABNORMAL LOW
RBC: 3.46 — ABNORMAL LOW
WBC: 12.5 — ABNORMAL HIGH

## 2010-11-14 LAB — CBC
HCT: 37.9
MCHC: 33.7
MCV: 82.7
RBC: 4.59
WBC: 15.3 — ABNORMAL HIGH

## 2010-11-14 LAB — BASIC METABOLIC PANEL
BUN: 6
CO2: 24
Chloride: 103
Glucose, Bld: 107 — ABNORMAL HIGH
Potassium: 3.7

## 2010-11-15 LAB — URINALYSIS, ROUTINE W REFLEX MICROSCOPIC
Bilirubin Urine: NEGATIVE
Glucose, UA: 100 — AB
Hgb urine dipstick: NEGATIVE
Ketones, ur: NEGATIVE
pH: 6

## 2011-02-06 LAB — OB RESULTS CONSOLE ANTIBODY SCREEN: Antibody Screen: NEGATIVE

## 2011-02-06 LAB — OB RESULTS CONSOLE RPR: RPR: NONREACTIVE

## 2011-02-06 LAB — OB RESULTS CONSOLE ABO/RH

## 2011-03-28 ENCOUNTER — Encounter: Payer: Medicaid Other | Attending: Obstetrics and Gynecology | Admitting: *Deleted

## 2011-03-28 DIAGNOSIS — O99810 Abnormal glucose complicating pregnancy: Secondary | ICD-10-CM | POA: Insufficient documentation

## 2011-03-28 DIAGNOSIS — Z713 Dietary counseling and surveillance: Secondary | ICD-10-CM | POA: Insufficient documentation

## 2011-03-29 ENCOUNTER — Encounter: Payer: Self-pay | Admitting: *Deleted

## 2011-03-29 NOTE — Patient Instructions (Signed)
Goals:  Check glucose levels per MD as instructed  Follow Gestational Diabetes Diet as instructed  Call for follow-up as needed    

## 2011-03-29 NOTE — Progress Notes (Signed)
  Patient was seen on 03/28/2011 for Gestational Diabetes self-management class at the [facility]. The following learning objectives were met by the patient during this course:   States the definition of Gestational Diabetes  States why dietary management is important in controlling blood glucose  Describes the effects each nutrient has on blood glucose levels  Demonstrates ability to create a balanced meal plan  Demonstrates carbohydrate counting   States when to check blood glucose levels  Demonstrates proper blood glucose monitoring techniques  States the effect of stress and exercise on blood glucose levels  States the importance of limiting caffeine and abstaining from alcohol and smoking  Blood glucose monitor given: Accu Chek Nano BG Monitoring Kit Lot # [id number] Exp: 06/28/2012 Blood glucose reading: 163 mg/dl  Patient instructed to monitor glucose levels: FBS: 60 - <90 2 hour: <120  *Patient received handouts:  Nutrition Diabetes and Pregnancy  Carbohydrate Counting List  Patient will be seen for follow-up as needed.

## 2011-04-05 ENCOUNTER — Encounter (INDEPENDENT_AMBULATORY_CARE_PROVIDER_SITE_OTHER): Payer: Medicaid Other | Admitting: Obstetrics and Gynecology

## 2011-04-05 DIAGNOSIS — Z331 Pregnant state, incidental: Secondary | ICD-10-CM

## 2011-04-05 DIAGNOSIS — O99810 Abnormal glucose complicating pregnancy: Secondary | ICD-10-CM

## 2011-04-17 ENCOUNTER — Other Ambulatory Visit: Payer: Self-pay

## 2011-04-17 ENCOUNTER — Encounter (INDEPENDENT_AMBULATORY_CARE_PROVIDER_SITE_OTHER): Payer: Medicaid Other | Admitting: Obstetrics and Gynecology

## 2011-04-17 ENCOUNTER — Other Ambulatory Visit: Payer: Self-pay | Admitting: Obstetrics and Gynecology

## 2011-04-17 DIAGNOSIS — O99810 Abnormal glucose complicating pregnancy: Secondary | ICD-10-CM

## 2011-04-17 DIAGNOSIS — Z3689 Encounter for other specified antenatal screening: Secondary | ICD-10-CM

## 2011-04-20 ENCOUNTER — Ambulatory Visit (HOSPITAL_COMMUNITY)
Admission: RE | Admit: 2011-04-20 | Discharge: 2011-04-20 | Disposition: A | Discharge status: Home / Self Care | Payer: Medicaid Other | Source: Ambulatory Visit | Attending: Obstetrics and Gynecology | Admitting: Obstetrics and Gynecology

## 2011-04-20 ENCOUNTER — Ambulatory Visit (HOSPITAL_COMMUNITY): Payer: Medicaid Other

## 2011-04-20 DIAGNOSIS — Z3689 Encounter for other specified antenatal screening: Secondary | ICD-10-CM

## 2011-04-20 DIAGNOSIS — Z1389 Encounter for screening for other disorder: Secondary | ICD-10-CM | POA: Insufficient documentation

## 2011-04-20 DIAGNOSIS — Z363 Encounter for antenatal screening for malformations: Secondary | ICD-10-CM | POA: Insufficient documentation

## 2011-04-20 DIAGNOSIS — O99810 Abnormal glucose complicating pregnancy: Secondary | ICD-10-CM | POA: Insufficient documentation

## 2011-04-20 DIAGNOSIS — O358XX0 Maternal care for other (suspected) fetal abnormality and damage, not applicable or unspecified: Secondary | ICD-10-CM | POA: Insufficient documentation

## 2011-05-04 ENCOUNTER — Encounter: Payer: Medicaid Other | Admitting: Obstetrics and Gynecology

## 2011-05-07 ENCOUNTER — Encounter: Payer: Medicaid Other | Admitting: Registered Nurse

## 2011-05-07 ENCOUNTER — Encounter (INDEPENDENT_AMBULATORY_CARE_PROVIDER_SITE_OTHER): Payer: Medicaid Other | Admitting: Registered Nurse

## 2011-05-07 DIAGNOSIS — Z331 Pregnant state, incidental: Secondary | ICD-10-CM

## 2011-05-17 ENCOUNTER — Encounter: Payer: Self-pay | Admitting: Obstetrics and Gynecology

## 2011-05-17 ENCOUNTER — Ambulatory Visit (INDEPENDENT_AMBULATORY_CARE_PROVIDER_SITE_OTHER): Payer: Medicaid Other | Admitting: Obstetrics and Gynecology

## 2011-05-17 VITALS — BP 124/70 | Wt 241.0 lb

## 2011-05-17 DIAGNOSIS — O24919 Unspecified diabetes mellitus in pregnancy, unspecified trimester: Secondary | ICD-10-CM

## 2011-05-17 DIAGNOSIS — O24119 Pre-existing diabetes mellitus, type 2, in pregnancy, unspecified trimester: Secondary | ICD-10-CM | POA: Insufficient documentation

## 2011-05-17 DIAGNOSIS — E119 Type 2 diabetes mellitus without complications: Secondary | ICD-10-CM

## 2011-05-17 NOTE — Progress Notes (Addendum)
No complaints ALL CBGS elevated Currently on 6u NPH qam and 12u NPH qpm Increase to 10 qam and 16qpm and by 2u q48hrs if still elevated U/S at [facility] 04/20/11 c/w dates, nl anatomy, nl fluid, cvx 4.1cm RTO in 1-2 wks for f/u AFP and 1st tri screen neg

## 2011-05-22 DIAGNOSIS — T8131XA Disruption of external operation (surgical) wound, not elsewhere classified, initial encounter: Secondary | ICD-10-CM | POA: Insufficient documentation

## 2011-05-22 DIAGNOSIS — O24119 Pre-existing diabetes mellitus, type 2, in pregnancy, unspecified trimester: Secondary | ICD-10-CM

## 2011-05-22 DIAGNOSIS — IMO0002 Reserved for concepts with insufficient information to code with codable children: Secondary | ICD-10-CM | POA: Insufficient documentation

## 2011-05-22 DIAGNOSIS — Z87891 Personal history of nicotine dependence: Secondary | ICD-10-CM | POA: Insufficient documentation

## 2011-05-29 ENCOUNTER — Ambulatory Visit (INDEPENDENT_AMBULATORY_CARE_PROVIDER_SITE_OTHER): Payer: Medicaid Other | Admitting: Obstetrics and Gynecology

## 2011-05-29 ENCOUNTER — Encounter: Payer: Self-pay | Admitting: Obstetrics and Gynecology

## 2011-05-29 VITALS — BP 104/62 | Wt 244.0 lb

## 2011-05-29 DIAGNOSIS — R102 Pelvic and perineal pain unspecified side: Secondary | ICD-10-CM

## 2011-05-29 DIAGNOSIS — N949 Unspecified condition associated with female genital organs and menstrual cycle: Secondary | ICD-10-CM

## 2011-05-29 DIAGNOSIS — O26899 Other specified pregnancy related conditions, unspecified trimester: Secondary | ICD-10-CM

## 2011-05-29 DIAGNOSIS — E119 Type 2 diabetes mellitus without complications: Secondary | ICD-10-CM

## 2011-05-29 DIAGNOSIS — O24119 Pre-existing diabetes mellitus, type 2, in pregnancy, unspecified trimester: Secondary | ICD-10-CM

## 2011-05-29 DIAGNOSIS — N898 Other specified noninflammatory disorders of vagina: Secondary | ICD-10-CM

## 2011-05-29 DIAGNOSIS — O9989 Other specified diseases and conditions complicating pregnancy, childbirth and the puerperium: Secondary | ICD-10-CM

## 2011-05-29 DIAGNOSIS — O24919 Unspecified diabetes mellitus in pregnancy, unspecified trimester: Secondary | ICD-10-CM

## 2011-05-29 LAB — POCT URINALYSIS DIPSTICK
Bilirubin, UA: NEGATIVE
Nitrite, UA: NEGATIVE
Spec Grav, UA: 1.015

## 2011-05-29 LAB — POCT WET PREP (WET MOUNT)
Clue Cells Wet Prep Whiff POC: NEGATIVE
pH: 4.5

## 2011-05-29 NOTE — Progress Notes (Addendum)
C/o frequency and pressure Urine to culture All CBGs increased Increase to 14u NPH qam and 20qpm and increase by 2u as instructed Pt did not increase by 2 last time RTO 1 wk

## 2011-06-08 ENCOUNTER — Encounter: Payer: Medicaid Other | Admitting: Obstetrics and Gynecology

## 2011-06-15 ENCOUNTER — Other Ambulatory Visit: Payer: Self-pay | Admitting: Obstetrics and Gynecology

## 2011-06-15 ENCOUNTER — Ambulatory Visit (INDEPENDENT_AMBULATORY_CARE_PROVIDER_SITE_OTHER): Payer: Medicaid Other | Admitting: Obstetrics and Gynecology

## 2011-06-15 VITALS — BP 100/62 | Wt 244.0 lb

## 2011-06-15 DIAGNOSIS — O24419 Gestational diabetes mellitus in pregnancy, unspecified control: Secondary | ICD-10-CM

## 2011-06-15 DIAGNOSIS — O99810 Abnormal glucose complicating pregnancy: Secondary | ICD-10-CM

## 2011-06-15 LAB — CBC
HCT: 37 % (ref 36.0–46.0)
Hemoglobin: 12.1 g/dL (ref 12.0–15.0)
MCH: 27.5 pg (ref 26.0–34.0)
MCHC: 32.7 g/dL (ref 30.0–36.0)

## 2011-06-15 LAB — RPR

## 2011-06-15 MED ORDER — INSULIN NPH (HUMAN) (ISOPHANE) 100 UNIT/ML ~~LOC~~ SUSP: 30.0000 [IU] | Freq: Every day | SUBCUTANEOUS | Status: DC

## 2011-06-15 MED ORDER — INSULIN NPH (HUMAN) (ISOPHANE) 100 UNIT/ML ~~LOC~~ SUSP: 34.0000 [IU] | Freq: Every day | SUBCUTANEOUS | Status: DC

## 2011-06-15 NOTE — Progress Notes (Signed)
No complaints Currently on 24NPH qam and 30NPH qpm Poorly controlled instructions given to cont to inc as needed Next available for u/s for EFW and BPP 2 GDM and S>D Start 2x/wk NSTs at [facility]

## 2011-06-15 NOTE — Telephone Encounter (Signed)
[name]/had appt w/AR today

## 2011-06-18 ENCOUNTER — Telehealth: Payer: Self-pay | Admitting: Obstetrics and Gynecology

## 2011-06-18 NOTE — Telephone Encounter (Signed)
[name]/AR pt °

## 2011-06-19 ENCOUNTER — Telehealth: Payer: Self-pay

## 2011-06-19 NOTE — Telephone Encounter (Signed)
LM for pt to use OTC hydrocortisone cream for itchy spot on back. [name] A

## 2011-06-19 NOTE — Telephone Encounter (Signed)
Called pharmacy to clarify instructions on Humulin N  Directions. Melody Comas A

## 2011-06-19 NOTE — Telephone Encounter (Signed)
Spoke to pt to let her know her medication ? Has been taken care of with her pharmacy. Pt states she has an itchy spot on her back that she's been using Mycolog cream on that was initially rx'd to her daughter and not to her. I told her to stop that since it wasn't meant for her and that I would check with Dr. Su Hilt and get back with her w/ recommendations. Melody Comas A

## 2011-07-06 ENCOUNTER — Ambulatory Visit (INDEPENDENT_AMBULATORY_CARE_PROVIDER_SITE_OTHER): Payer: Medicaid Other

## 2011-07-06 ENCOUNTER — Encounter: Payer: Self-pay | Admitting: Obstetrics and Gynecology

## 2011-07-06 ENCOUNTER — Ambulatory Visit (INDEPENDENT_AMBULATORY_CARE_PROVIDER_SITE_OTHER): Payer: Medicaid Other | Admitting: Obstetrics and Gynecology

## 2011-07-06 VITALS — BP 100/64 | Wt 247.0 lb

## 2011-07-06 DIAGNOSIS — Z98891 History of uterine scar from previous surgery: Secondary | ICD-10-CM

## 2011-07-06 DIAGNOSIS — O99810 Abnormal glucose complicating pregnancy: Secondary | ICD-10-CM

## 2011-07-06 DIAGNOSIS — O24419 Gestational diabetes mellitus in pregnancy, unspecified control: Secondary | ICD-10-CM

## 2011-07-06 DIAGNOSIS — Z9889 Other specified postprocedural states: Secondary | ICD-10-CM

## 2011-07-06 DIAGNOSIS — R350 Frequency of micturition: Secondary | ICD-10-CM

## 2011-07-06 LAB — US OB FOLLOW UP

## 2011-07-06 LAB — POCT URINALYSIS DIPSTICK
Bilirubin, UA: NEGATIVE
Spec Grav, UA: 1.01
pH, UA: 6

## 2011-07-06 MED ORDER — INSULIN NPH (HUMAN) (ISOPHANE) 100 UNIT/ML ~~LOC~~ SUSP: 50.0000 [IU] | SUBCUTANEOUS | Status: DC

## 2011-07-06 MED ORDER — INSULIN NPH (HUMAN) (ISOPHANE) 100 UNIT/ML ~~LOC~~ SUSP: 54.0000 [IU] | Freq: Every day | SUBCUTANEOUS | Status: DC

## 2011-07-06 NOTE — Progress Notes (Signed)
Ultrasound today estimated fetal weight 4 lbs. 2 oz. 90th percentile AFI 19 cm cervix 4.1 cm BPP 8 out of 8 vertex presentation posterior fundal placenta CBGs out of control pt is noncompliant - increase to 50u qam and 54u qhs of NPH and increase by 2u each day it continues to be elevated Start 2x/wk NSTs next week secondary to GDM on insulin poorly controlled FKCs encouraged Refer to derm secondary to lesion on back Plans repeat c/s and BTL - sign tubal papers today Pt requesting refill on insulin-Rxs sent to pharmacy

## 2011-07-10 ENCOUNTER — Ambulatory Visit (INDEPENDENT_AMBULATORY_CARE_PROVIDER_SITE_OTHER): Payer: Medicaid Other | Admitting: Obstetrics and Gynecology

## 2011-07-10 VITALS — BP 100/64

## 2011-07-10 DIAGNOSIS — Z331 Pregnant state, incidental: Secondary | ICD-10-CM

## 2011-07-10 DIAGNOSIS — O24419 Gestational diabetes mellitus in pregnancy, unspecified control: Secondary | ICD-10-CM

## 2011-07-10 DIAGNOSIS — O99810 Abnormal glucose complicating pregnancy: Secondary | ICD-10-CM

## 2011-07-10 NOTE — Progress Notes (Signed)
NST ONLY TODAY C/O OF OCC VAG DISCOMFORT NO CONTRACTION OR C/O OTHER WISE df NST reactive FU as scheduled on 07/13/11

## 2011-07-11 ENCOUNTER — Telehealth: Payer: Self-pay | Admitting: Obstetrics and Gynecology

## 2011-07-11 ENCOUNTER — Other Ambulatory Visit: Payer: Self-pay | Admitting: Obstetrics and Gynecology

## 2011-07-11 NOTE — Telephone Encounter (Signed)
See note

## 2011-07-11 NOTE — Telephone Encounter (Signed)
Repeat C/S and BTL scheduled for 08/31/11 @ 10:30 with AR/cnm.  Adrianne Pridgen

## 2011-07-13 ENCOUNTER — Ambulatory Visit: Payer: Medicaid Other | Admitting: Obstetrics and Gynecology

## 2011-07-13 ENCOUNTER — Ambulatory Visit (INDEPENDENT_AMBULATORY_CARE_PROVIDER_SITE_OTHER): Payer: Medicaid Other | Admitting: Obstetrics and Gynecology

## 2011-07-13 ENCOUNTER — Encounter: Payer: Self-pay | Admitting: Obstetrics and Gynecology

## 2011-07-13 VITALS — BP 116/74 | Wt 250.0 lb

## 2011-07-13 DIAGNOSIS — O99810 Abnormal glucose complicating pregnancy: Secondary | ICD-10-CM

## 2011-07-13 DIAGNOSIS — E669 Obesity, unspecified: Secondary | ICD-10-CM

## 2011-07-13 DIAGNOSIS — O24419 Gestational diabetes mellitus in pregnancy, unspecified control: Secondary | ICD-10-CM

## 2011-07-13 DIAGNOSIS — O24414 Gestational diabetes mellitus in pregnancy, insulin controlled: Secondary | ICD-10-CM

## 2011-07-13 NOTE — Progress Notes (Signed)
See note

## 2011-07-13 NOTE — Patient Instructions (Addendum)
Fetal Movement Counts Patient Name: __________________________________________________ Patient Due Date: ____________________ Kick counts is highly recommended in high risk pregnancies, but it is a good idea for every pregnant woman to do. Start counting fetal movements at 28 weeks of the pregnancy. Fetal movements increase after eating a full meal or eating or drinking something sweet (the blood sugar is higher). It is also important to drink plenty of fluids (well hydrated) before doing the count. Lie on your left side because it helps with the circulation or you can sit in a comfortable chair with your arms over your belly (abdomen) with no distractions around you. DOING THE COUNT  Try to do the count the same time of day each time you do it.   Mark the day and time, then see how long it takes for you to feel 10 movements (kicks, flutters, swishes, rolls). You should have at least 10 movements within 2 hours. You will most likely feel 10 movements in much less than 2 hours. If you do not, wait an hour and count again. After a couple of days you will see a pattern.   What you are looking for is a change in the pattern or not enough counts in 2 hours. Is it taking longer in time to reach 10 movements?  SEEK MEDICAL CARE IF:  You feel less than 10 counts in 2 hours. Tried twice.   No movement in one hour.   The pattern is changing or taking longer each day to reach 10 counts in 2 hours.   You feel the baby is not moving as it usually does.  Date: ____________ Movements: ____________ Start time: ____________ Finish time: ____________  Date: ____________ Movements: ____________ Start time: ____________ Finish time: ____________ Date: ____________ Movements: ____________ Start time: ____________ Finish time: ____________ Date: ____________ Movements: ____________ Start time: ____________ Finish time: ____________ Date: ____________ Movements: ____________ Start time: ____________ Finish time:  ____________ Date: ____________ Movements: ____________ Start time: ____________ Finish time: ____________ Date: ____________ Movements: ____________ Start time: ____________ Finish time: ____________ Date: ____________ Movements: ____________ Start time: ____________ Finish time: ____________  Date: ____________ Movements: ____________ Start time: ____________ Finish time: ____________ Date: ____________ Movements: ____________ Start time: ____________ Finish time: ____________ Date: ____________ Movements: ____________ Start time: ____________ Finish time: ____________ Date: ____________ Movements: ____________ Start time: ____________ Finish time: ____________ Date: ____________ Movements: ____________ Start time: ____________ Finish time: ____________ Date: ____________ Movements: ____________ Start time: ____________ Finish time: ____________ Date: ____________ Movements: ____________ Start time: ____________ Finish time: ____________  Date: ____________ Movements: ____________ Start time: ____________ Finish time: ____________ Date: ____________ Movements: ____________ Start time: ____________ Finish time: ____________ Date: ____________ Movements: ____________ Start time: ____________ Finish time: ____________ Date: ____________ Movements: ____________ Start time: ____________ Finish time: ____________ Date: ____________ Movements: ____________ Start time: ____________ Finish time: ____________ Date: ____________ Movements: ____________ Start time: ____________ Finish time: ____________ Date: ____________ Movements: ____________ Start time: ____________ Finish time: ____________  Date: ____________ Movements: ____________ Start time: ____________ Finish time: ____________ Date: ____________ Movements: ____________ Start time: ____________ Finish time: ____________ Date: ____________ Movements: ____________ Start time: ____________ Finish time: ____________ Date: ____________ Movements:  ____________ Start time: ____________ Finish time: ____________ Date: ____________ Movements: ____________ Start time: ____________ Finish time: ____________ Date: ____________ Movements: ____________ Start time: ____________ Finish time: ____________ Date: ____________ Movements: ____________ Start time: ____________ Finish time: ____________  Date: ____________ Movements: ____________ Start time: ____________ Finish time: ____________ Date: ____________ Movements: ____________ Start time: ____________ Finish time: ____________ Date: ____________ Movements: ____________ Start time:   ____________ Doreatha Martin time: ____________ Date: ____________ Movements: ____________ Start time: ____________ Doreatha Martin time: ____________ Date: ____________ Movements: ____________ Start time: ____________ Doreatha Martin time: ____________ Date: ____________ Movements: ____________ Start time: ____________ Doreatha Martin time: ____________ Date: ____________ Movements: ____________ Start time: ____________ Doreatha Martin time: ____________  Date: ____________ Movements: ____________ Start time: ____________ Doreatha Martin time: ____________ Date: ____________ Movements: ____________ Start time: ____________ Doreatha Martin time: ____________ Date: ____________ Movements: ____________ Start time: ____________ Doreatha Martin time: ____________ Date: ____________ Movements: ____________ Start time: ____________ Doreatha Martin time: ____________ Date: ____________ Movements: ____________ Start time: ____________ Doreatha Martin time: ____________ Date: ____________ Movements: ____________ Start time: ____________ Doreatha Martin time: ____________ Date: ____________ Movements: ____________ Start time: ____________ Doreatha Martin time: ____________  Date: ____________ Movements: ____________ Start time: ____________ Doreatha Martin time: ____________ Date: ____________ Movements: ____________ Start time: ____________ Doreatha Martin time: ____________ Date: ____________ Movements: ____________ Start time: ____________ Doreatha Martin  time: ____________ Date: ____________ Movements: ____________ Start time: ____________ Doreatha Martin time: ____________ Date: ____________ Movements: ____________ Start time: ____________ Doreatha Martin time: ____________ Date: ____________ Movements: ____________ Start time: ____________ Doreatha Martin time: ____________ Date: ____________ Movements: ____________ Start time: ____________ Doreatha Martin time: ____________  Date: ____________ Movements: ____________ Start time: ____________ Doreatha Martin time: ____________ Date: ____________ Movements: ____________ Start time: ____________ Doreatha Martin time: ____________ Date: ____________ Movements: ____________ Start time: ____________ Doreatha Martin time: ____________ Date: ____________ Movements: ____________ Start time: ____________ Doreatha Martin time: ____________ Date: ____________ Movements: ____________ Start time: ____________ Doreatha Martin time: ____________ Date: ____________ Movements: ____________ Start time: ____________ Doreatha Martin time: ____________ Document Released: 02/14/2006 Document Revised: 01/04/2011 Document Reviewed: 08/17/2008 [employer] Patient Information 2012 [employer]High Protein Diet A high protein diet means that high protein foods are added to your diet. Getting more protein in the diet is important for a number of reasons. Protein helps the body to build tissue, muscle, and to repair damage. People who have had surgery, injuries such as broken bones, infections, and burns, or illnesses such as cancer, may need more protein in their diet.  SERVING SIZES Measuring foods and serving sizes helps to make sure you are getting the right amount of food. The list below tells how big or small some common serving sizes are.   1 oz.........4 stacked dice.   3 oz........Marland KitchenDeck of cards.   1 tsp.......Marland KitchenTip of little finger.   1 tbs......Marland KitchenMarland KitchenThumb.   2 tbs.......Marland KitchenGolf ball.    cup......Marland KitchenHalf of a fist.   1 cup.......Marland KitchenA fist.  FOOD SOURCES OF PROTEIN Listed below are some food sources  of protein and the amount of protein they contain. Your Registered Dietitian can calculate how many grams of protein you need for your medical condition. High protein foods can be added to the diet at mealtime or as snacks. Be sure to have at least 1 protein-containing food at each meal and snack to ensure adequate intake.  Meats and Meat Substitutes / Protein (g)  3 oz poultry (chicken, [place]) / 26 g   3 oz tuna, canned in water / 26 g   3 oz fish (cod) / 21 g   3 oz red meat (beef, pork) / 21 g   4 oz tofu / 9 g   1 egg / 6 g    cup egg substitute / 5 g   1 cup dried beans / 15 g   1 cup soy milk / 4 g  Dairy / Protein (g)  1 cup milk (skim, 1%, 2%, whole) / 8 g    cup evaporated milk / 9 g   1 cup buttermilk / 8 g   1 cup low-fat plain  yogurt / 11 g   1 cup regular plain yogurt / 9 g    cup cottage cheese / 14 g   1 oz cheddar cheese / 7 g  Nuts / Protein (g)  2 tbs peanut butter / 8 g   1 oz peanuts / 7 g   2 tbs cashews / 5 g   2 tbs almonds / 5 g  Document Released: 01/15/2005 Document Revised: 01/04/2011 Document Reviewed: 10/18/2006 [facility] Patient Information 2012 [place], [place].

## 2011-07-13 NOTE — Progress Notes (Signed)
[redacted]w[redacted]d GFM NST reactive  Pt ph # [phone]  rv'd CBG's, FBS 80-91 - 2hr pp - 122-194 Currently on humulin 52units AM, and 56units PM rv'd w Dr Su Hilt and rec inc AM to 56units. - Called pt to inform, and rec inc by 2units in the AM if remain elevated as instructed rv'd diet and rec increasing protein  rv'd PTL sx's and FKC Lesion still present on back, will f/u derm referral, center mid-thoracic, round about 3cm raised, red lesion, "itches"   NST only Tuesday and NST w MD visit on Friday - cont 2x/wk NST - pt will bring rest of CBG's Tues (doesn't have last 3 days)  Growth [place] on 7-5 w BPP  Repeat C/S w BTL sched for 8-2 w AR

## 2011-07-17 ENCOUNTER — Ambulatory Visit (INDEPENDENT_AMBULATORY_CARE_PROVIDER_SITE_OTHER): Payer: Medicaid Other | Admitting: Obstetrics and Gynecology

## 2011-07-17 VITALS — BP 126/60 | Ht 66.0 in

## 2011-07-17 DIAGNOSIS — O99810 Abnormal glucose complicating pregnancy: Secondary | ICD-10-CM

## 2011-07-17 DIAGNOSIS — O24419 Gestational diabetes mellitus in pregnancy, unspecified control: Secondary | ICD-10-CM

## 2011-07-20 ENCOUNTER — Ambulatory Visit (INDEPENDENT_AMBULATORY_CARE_PROVIDER_SITE_OTHER): Payer: Medicaid Other

## 2011-07-20 ENCOUNTER — Other Ambulatory Visit: Payer: Medicaid Other

## 2011-07-20 VITALS — BP 110/64 | Wt 249.0 lb

## 2011-07-20 DIAGNOSIS — O99810 Abnormal glucose complicating pregnancy: Secondary | ICD-10-CM

## 2011-07-20 DIAGNOSIS — Z98891 History of uterine scar from previous surgery: Secondary | ICD-10-CM

## 2011-07-20 DIAGNOSIS — O24919 Unspecified diabetes mellitus in pregnancy, unspecified trimester: Secondary | ICD-10-CM

## 2011-07-20 DIAGNOSIS — O24419 Gestational diabetes mellitus in pregnancy, unspecified control: Secondary | ICD-10-CM

## 2011-07-20 DIAGNOSIS — Z9889 Other specified postprocedural states: Secondary | ICD-10-CM

## 2011-07-20 DIAGNOSIS — O26849 Uterine size-date discrepancy, unspecified trimester: Secondary | ICD-10-CM

## 2011-07-20 NOTE — Progress Notes (Signed)
No concerns per pt  NST completed today with PG, CMA  CBG's attached to paper chart

## 2011-07-20 NOTE — Progress Notes (Signed)
33 weeks.  cbg's for 6/18-6/21:  F=86-103 (1 outlier); PP=96-167 (3 outliers, and all after supper); pt reports elevations after eating "pizza and ice cream" but would not elaborate r/e all foods.  c/w VPH, and no changes made to insulin dosage.  Pt takes 56u NPH in AM and again in PM.   GFM.  Rev'd PTL precautions and FKC.  Desires delivery w/ AR; scheduled already on 08/31/11 per pt.  Doesn't have pre-op scheduled she states.  Disc'd circ.   Growth u/s and BPP next week; stressed compliancy w/ diet.

## 2011-07-26 ENCOUNTER — Encounter: Payer: Self-pay | Admitting: Obstetrics and Gynecology

## 2011-07-26 ENCOUNTER — Ambulatory Visit (INDEPENDENT_AMBULATORY_CARE_PROVIDER_SITE_OTHER): Payer: Medicaid Other

## 2011-07-26 ENCOUNTER — Ambulatory Visit (INDEPENDENT_AMBULATORY_CARE_PROVIDER_SITE_OTHER): Payer: Medicaid Other | Admitting: Obstetrics and Gynecology

## 2011-07-26 VITALS — BP 110/58 | Ht 66.0 in | Wt 249.5 lb

## 2011-07-26 DIAGNOSIS — O99810 Abnormal glucose complicating pregnancy: Secondary | ICD-10-CM

## 2011-07-26 DIAGNOSIS — Z98891 History of uterine scar from previous surgery: Secondary | ICD-10-CM

## 2011-07-26 DIAGNOSIS — O24419 Gestational diabetes mellitus in pregnancy, unspecified control: Secondary | ICD-10-CM

## 2011-07-26 DIAGNOSIS — E669 Obesity, unspecified: Secondary | ICD-10-CM

## 2011-07-26 DIAGNOSIS — O26849 Uterine size-date discrepancy, unspecified trimester: Secondary | ICD-10-CM

## 2011-07-26 DIAGNOSIS — Z9889 Other specified postprocedural states: Secondary | ICD-10-CM

## 2011-07-26 DIAGNOSIS — O24919 Unspecified diabetes mellitus in pregnancy, unspecified trimester: Secondary | ICD-10-CM

## 2011-07-26 NOTE — Progress Notes (Signed)
Pt had an appt today with [facility] for a "rash on her back".  Pt was prescribed Clobetasol 0.05%(apply topically twice daily to affected area) and Oxistat lotion 1%(apply topically twice daily to affected area) and pt wants to know if safe to take during pregnancy. OK  For use during pregnancy. CBGs:  FBS:87-108   2 hr: 116-183   Many dietary indiscretions [place]:  BPP: 8/8  EFW 6LB 5 OZ 88%ILE  AFI nl 60% RECOMMENDATION:  Increase insulin to NPH 60 am and 58 at hs       BPP nv

## 2011-07-26 NOTE — Patient Instructions (Addendum)
NEW INSULIN: Morning 60 units NPH    Bedtime:  58 units NPH

## 2011-07-30 NOTE — Progress Notes (Signed)
NST Reactive

## 2011-07-31 ENCOUNTER — Encounter: Payer: Self-pay | Admitting: Obstetrics and Gynecology

## 2011-07-31 ENCOUNTER — Ambulatory Visit (INDEPENDENT_AMBULATORY_CARE_PROVIDER_SITE_OTHER): Payer: Medicaid Other

## 2011-07-31 ENCOUNTER — Telehealth: Payer: Self-pay

## 2011-07-31 ENCOUNTER — Ambulatory Visit (INDEPENDENT_AMBULATORY_CARE_PROVIDER_SITE_OTHER): Payer: Medicaid Other | Admitting: Obstetrics and Gynecology

## 2011-07-31 ENCOUNTER — Other Ambulatory Visit: Payer: Self-pay | Admitting: Obstetrics and Gynecology

## 2011-07-31 VITALS — BP 110/78 | Wt 253.0 lb

## 2011-07-31 DIAGNOSIS — O24419 Gestational diabetes mellitus in pregnancy, unspecified control: Secondary | ICD-10-CM

## 2011-07-31 DIAGNOSIS — O99810 Abnormal glucose complicating pregnancy: Secondary | ICD-10-CM

## 2011-07-31 NOTE — Telephone Encounter (Signed)
LM for pt. Pt needs to increase her insulin.

## 2011-07-31 NOTE — Progress Notes (Signed)
Here for BPP and CBG f/u.  Brought CBG values--FBS 86-103, 2 hour pc 106-202.  Continues to struggle with appropriate dietary choices and keeping up with CBGs.  Currently on 60 u NPH in am, 58 u NPH at hs. BPP 8/8, cx 3.19, AFI 17.2, 60%ile.  Consulted with Dr. Donalee Citrin increase insulin to 64 u NPH am, 60 u NPH at bedtime. BPP weekly, with next growth [place] in 3 weeks. Scheduled for C/S, with BTL 08/31/11. Will have office inform patient of change in insulin.

## 2011-07-31 NOTE — Telephone Encounter (Signed)
Message copied by Janeece Agee on Tue Jul 31, 2011  6:18 PM ------      Message from: Cornelius Moras      Created: Tue Jul 31, 2011  6:15 PM      Regarding: [name] insulin doses       Please call patient on Wednesday--instruct her to increase her insulin doses to :            64 units NPH in am      60 units NPH at hs            Thanks!            VL

## 2011-08-01 NOTE — Telephone Encounter (Signed)
Spoke with pt informing her to increase her insulin to 64 units in am and 60 units at hs per VL. Pt agrees and voices understanding.

## 2011-08-06 ENCOUNTER — Other Ambulatory Visit: Payer: Self-pay

## 2011-08-06 DIAGNOSIS — O24919 Unspecified diabetes mellitus in pregnancy, unspecified trimester: Secondary | ICD-10-CM

## 2011-08-06 LAB — US OB FOLLOW UP

## 2011-08-09 ENCOUNTER — Encounter: Payer: Self-pay | Admitting: Obstetrics and Gynecology

## 2011-08-09 ENCOUNTER — Ambulatory Visit (INDEPENDENT_AMBULATORY_CARE_PROVIDER_SITE_OTHER): Payer: Medicaid Other | Admitting: Obstetrics and Gynecology

## 2011-08-09 ENCOUNTER — Ambulatory Visit (INDEPENDENT_AMBULATORY_CARE_PROVIDER_SITE_OTHER): Payer: Medicaid Other

## 2011-08-09 VITALS — BP 122/66 | Wt 253.0 lb

## 2011-08-09 DIAGNOSIS — O24419 Gestational diabetes mellitus in pregnancy, unspecified control: Secondary | ICD-10-CM

## 2011-08-09 DIAGNOSIS — O99810 Abnormal glucose complicating pregnancy: Secondary | ICD-10-CM

## 2011-08-09 DIAGNOSIS — Z331 Pregnant state, incidental: Secondary | ICD-10-CM

## 2011-08-09 NOTE — Progress Notes (Signed)
Pt c/o vaginal pain. Pt states she is unable to give urine sample but will do so before she checks out of the office.

## 2011-08-09 NOTE — Progress Notes (Signed)
[redacted]w[redacted]d, GDM, Insulin, Humalin N ROB , planned repeat C/S 08/31/11. BPP 8/8  08/09/11) Blood Glucose Log reviewed - Fasting B/S 83 - 90, Postt - Prandial Lunch elevated: 93 - 156, Post - Prandial Dinner 138 - 171 Consult Dr Normand Sloop. To increase Insulin in am to 70 units s/c tomorrow and to increase x 2 units next day if post prandial Blood Glucose  Remains elevated. Explained to patient and states that she understands instructions.  Impression: GDM becoming more insulin resistant.  Plan: To increase am insulin Humalin N 70 units s/c am and possible need to increase x 2 more units if B/Glucose post prandial not improved ROB x 1 week. BPP next week.

## 2011-08-11 LAB — STREP B DNA PROBE: GBSP: NEGATIVE

## 2011-08-13 ENCOUNTER — Encounter: Payer: Medicaid Other | Admitting: Obstetrics and Gynecology

## 2011-08-16 ENCOUNTER — Ambulatory Visit (INDEPENDENT_AMBULATORY_CARE_PROVIDER_SITE_OTHER): Payer: Medicaid Other

## 2011-08-16 ENCOUNTER — Ambulatory Visit (INDEPENDENT_AMBULATORY_CARE_PROVIDER_SITE_OTHER): Payer: Medicaid Other | Admitting: Obstetrics and Gynecology

## 2011-08-16 ENCOUNTER — Other Ambulatory Visit: Payer: Self-pay

## 2011-08-16 VITALS — BP 106/60 | Wt 255.0 lb

## 2011-08-16 DIAGNOSIS — O24419 Gestational diabetes mellitus in pregnancy, unspecified control: Secondary | ICD-10-CM

## 2011-08-16 DIAGNOSIS — O99810 Abnormal glucose complicating pregnancy: Secondary | ICD-10-CM

## 2011-08-16 LAB — POCT URINALYSIS DIPSTICK
Leukocytes, UA: NEGATIVE
pH, UA: 7

## 2011-08-16 NOTE — Progress Notes (Signed)
Type 2 DB: NPH 70 units am and 60 units HS         FBS 78-105   2 hr PC 130-185 Pt not following diet: reviewed risk of neonatal hypoglycemia. Pt encouraged to follow diet better until cesarean section. Will increase NPH to 74 units am and keep 60 units HS GBS negative

## 2011-08-16 NOTE — Progress Notes (Signed)
GBS neg.  No complaints. Pt has sugar diary. To be scanned. BPP today 8/8 in 17 minutes.  AFI = 65 th %tile.

## 2011-08-17 ENCOUNTER — Encounter (HOSPITAL_COMMUNITY): Payer: Self-pay

## 2011-08-21 ENCOUNTER — Other Ambulatory Visit: Payer: Medicaid Other

## 2011-08-21 ENCOUNTER — Encounter: Payer: Medicaid Other | Admitting: Obstetrics and Gynecology

## 2011-08-22 ENCOUNTER — Other Ambulatory Visit (INDEPENDENT_AMBULATORY_CARE_PROVIDER_SITE_OTHER): Payer: Medicaid Other

## 2011-08-22 ENCOUNTER — Other Ambulatory Visit: Payer: Self-pay

## 2011-08-22 ENCOUNTER — Encounter: Payer: Self-pay | Admitting: Obstetrics and Gynecology

## 2011-08-22 ENCOUNTER — Ambulatory Visit (INDEPENDENT_AMBULATORY_CARE_PROVIDER_SITE_OTHER): Payer: Medicaid Other | Admitting: Obstetrics and Gynecology

## 2011-08-22 VITALS — BP 104/56 | Wt 257.0 lb

## 2011-08-22 DIAGNOSIS — O99810 Abnormal glucose complicating pregnancy: Secondary | ICD-10-CM

## 2011-08-22 DIAGNOSIS — IMO0001 Reserved for inherently not codable concepts without codable children: Secondary | ICD-10-CM

## 2011-08-22 DIAGNOSIS — R350 Frequency of micturition: Secondary | ICD-10-CM

## 2011-08-22 DIAGNOSIS — O24419 Gestational diabetes mellitus in pregnancy, unspecified control: Secondary | ICD-10-CM

## 2011-08-22 LAB — POCT URINALYSIS DIPSTICK
Protein, UA: NEGATIVE
Spec Grav, UA: 1.015
Urobilinogen, UA: NEGATIVE

## 2011-08-22 NOTE — Progress Notes (Signed)
Ultrasound today secondary to GDM on insulin probable type II diabetic estimated fetal weight 8 lbs. 12 oz. 96.8 percentile AFI of 19.4 cm BPP is 8 out of 8 vertex posterior placenta and normal fluid Most CBGs are elevated - inc to 78u NPH from 74 in AM and cont 60u NPH qpm H/o being noncompliant sched for repeat c/s and BTL on 08/31/11 BPP in 1wk with visit

## 2011-08-23 ENCOUNTER — Encounter (HOSPITAL_COMMUNITY)
Admission: RE | Admit: 2011-08-23 | Discharge: 2011-08-23 | Disposition: A | Discharge status: Home / Self Care | Payer: Medicaid Other | Source: Ambulatory Visit | Attending: Obstetrics and Gynecology | Admitting: Obstetrics and Gynecology

## 2011-08-23 ENCOUNTER — Encounter (HOSPITAL_COMMUNITY): Payer: Self-pay

## 2011-08-23 VITALS — BP 131/75 | Ht 66.0 in | Wt 257.0 lb

## 2011-08-23 DIAGNOSIS — O26849 Uterine size-date discrepancy, unspecified trimester: Secondary | ICD-10-CM

## 2011-08-23 DIAGNOSIS — E669 Obesity, unspecified: Secondary | ICD-10-CM

## 2011-08-23 DIAGNOSIS — O24119 Pre-existing diabetes mellitus, type 2, in pregnancy, unspecified trimester: Secondary | ICD-10-CM

## 2011-08-23 LAB — TYPE AND SCREEN: ABO/RH(D): A POS

## 2011-08-23 LAB — BASIC METABOLIC PANEL
BUN: 7 mg/dL (ref 6–23)
Creatinine, Ser: 0.48 mg/dL — ABNORMAL LOW (ref 0.50–1.10)
GFR calc Af Amer: >90 mL/min (ref >90)
GFR calc non Af Amer: >90 mL/min (ref >90)

## 2011-08-23 LAB — SURGICAL PCR SCREEN: Staphylococcus aureus: NEGATIVE

## 2011-08-23 LAB — CBC
MCH: 27.5 pg (ref 26.0–34.0)
MCV: 82.8 fL (ref 78.0–100.0)
Platelets: 226 10*3/uL (ref 150–400)
RDW: 13.8 % (ref 11.5–15.5)

## 2011-08-23 NOTE — Patient Instructions (Addendum)
  Your procedure is scheduled on: Friday, August 2  Enter through the [employer] of [facility] at: 8:00am Pick up the phone at the desk and dial [phone] and inform us of your arrival.  Please call this number if you have any problems the morning of surgery: [phone]  Remember: Do not eat food after midnight: Thursday Do not drink clear liquids after: Thursday Take these medicines the morning of surgery with a SIP OF WATER:  Humulin 30 units the night before your surgery No insulin the morning of your surgery  Do not wear jewelry, make-up, or FINGER nail polish No metal in your hair or on your body. Do not wear lotions, powders, perfumes or deodorant. Do not shave 48 hours prior to surgery. Do not bring valuables to the hospital. Contacts, dentures or bridgework may not be worn into surgery.  Leave suitcase in the car. After Surgery it may be brought to your room. For patients being admitted to the hospital, checkout time is 11:00am the day of discharge.  Remember to use your hibiclens as instructed.Please shower with 1/2 bottle the evening before your surgery and the other 1/2 bottle the morning of surgery. Neck down avoiding private area.

## 2011-08-30 ENCOUNTER — Ambulatory Visit (INDEPENDENT_AMBULATORY_CARE_PROVIDER_SITE_OTHER): Payer: Medicaid Other | Admitting: Obstetrics and Gynecology

## 2011-08-30 ENCOUNTER — Encounter: Payer: Self-pay | Admitting: Obstetrics and Gynecology

## 2011-08-30 ENCOUNTER — Ambulatory Visit (INDEPENDENT_AMBULATORY_CARE_PROVIDER_SITE_OTHER): Payer: Medicaid Other

## 2011-08-30 ENCOUNTER — Other Ambulatory Visit: Payer: Self-pay

## 2011-08-30 VITALS — BP 112/68 | Wt 256.0 lb

## 2011-08-30 DIAGNOSIS — O24419 Gestational diabetes mellitus in pregnancy, unspecified control: Secondary | ICD-10-CM

## 2011-08-30 DIAGNOSIS — E119 Type 2 diabetes mellitus without complications: Secondary | ICD-10-CM

## 2011-08-30 DIAGNOSIS — O99810 Abnormal glucose complicating pregnancy: Secondary | ICD-10-CM

## 2011-08-30 DIAGNOSIS — O24919 Unspecified diabetes mellitus in pregnancy, unspecified trimester: Secondary | ICD-10-CM

## 2011-08-30 DIAGNOSIS — O24119 Pre-existing diabetes mellitus, type 2, in pregnancy, unspecified trimester: Secondary | ICD-10-CM

## 2011-08-30 LAB — US OB FOLLOW UP

## 2011-08-30 NOTE — Progress Notes (Signed)
Pt scheduled for C-section tomorrow.   Ultrasound shows:  SIUP  S=D     [place] EDD: 09/07/2011           AFI: 16.5           Cervical length: n/a cm           Placenta localization: fundal posterior           Fetal presentation: vertex                 Gender : female FBS 79-88

## 2011-08-30 NOTE — H&P (Signed)
[name] is a 36 y.o. female presenting for repeat cesarean section and BTL. Denies srom, vag bleeding, uc, with +FM. Took 30 U insulin last pm. History OB History    Grav Para Term Preterm Abortions TAB SAB Ect Mult Living   7 4 4  2 1 1   4      Past Medical History  Diagnosis Date  . Advanced maternal age in pregnancy   . Former smoker Quit when she found she was pregnant.  . Dehiscence of surgical wound     hx of  . Gestational diabetes   . H/O gonorrhea 1993  . H/O chlamydia infection   . H/O rubella   . H/O cystitis   . Complication of anesthesia 2004    Difficulty breathing  . Anemia     During pregnancy  . Vulvar rash 11/2004  . Psychosocial stressors   . Stress 2007  . Hx of constipation 2008   Past Surgical History  Procedure Date  . Cholecystectomy   . Cesarean section     x 4   meds: Humulin N 70U am, HS 60U  Family History: family history includes Asthma in her daughter; COPD in her maternal grandmother, mother, paternal aunt, and paternal uncle; Depression in her maternal grandmother; Heart disease in her maternal grandmother and mother; Hypertension in her father, maternal grandmother, and sister; Kidney disease in her sister; and Stroke in her maternal grandmother. Social History:  reports that she has quit smoking. She has never used smokeless tobacco. She reports that she does not drink alcohol or use illicit drugs.   Prenatal Transfer Tool  Maternal Diabetes: Yes:  Diabetes Type:  Insulin/Medication controlled Genetic Screening: Normal Maternal Ultrasounds/Referrals: Normal Fetal Ultrasounds or other Referrals:  None Maternal Substance Abuse:  No Significant Maternal Medications:  Meds include: Other: see prenatal record insulin Significant Maternal Lab Results:  None GBS neg Other Comments:  None  ROS    Last menstrual period 11/26/2010. Exam Physical Exam Calm, no distress, HEENT WNL,  lungs clear bilaterally, AP RRR, abd soft nt,no  masses, not tympanic bowel sounds active, abdomen nontender, Normal hair distrubition mons pubis,  EGBUS WNL,  edema to lower extremities Fhts present  OB hx 2/99 42 week C/S 8*1 5/01 SAB 10/03 41 8#10 C/S failed VBAC 2006 SAB 7/08 C/S 8#10 1/10 C/S 9#15  Prenatal labs: ABO, Rh: --/--/A POS (07/25 1100) Antibody: NEG (07/25 1054) Rubella: Immune (01/08 0000) RPR: NON REACTIVE (07/25 1055)  HBsAg:   neg HIV: Non-reactive (01/08 0000)  GBS: NEGATIVE (07/11 0917)   Assessment/Plan: 39 week IUP P Repeat cesarean section BTL, reviewed risks hemorrhage, infection, injury to bowel or bladder, verbalized understanding. Collaboration with Dr. Su Hilt.   KREBSBACH, MARY 08/30/2011, 12:59 PM  Agree with above.  Risks, benefits, alternatives discussed with patient, patient verbalized understanding and consent signed and witnessed. - AYR

## 2011-08-31 ENCOUNTER — Encounter (HOSPITAL_COMMUNITY): Payer: Self-pay | Admitting: Anesthesiology

## 2011-08-31 ENCOUNTER — Encounter (HOSPITAL_COMMUNITY): Payer: Self-pay | Admitting: *Deleted

## 2011-08-31 ENCOUNTER — Encounter (HOSPITAL_COMMUNITY)
Admission: RE | Disposition: A | Discharge status: Home / Self Care | Payer: Self-pay | Source: Ambulatory Visit | Attending: Obstetrics and Gynecology

## 2011-08-31 ENCOUNTER — Inpatient Hospital Stay (HOSPITAL_COMMUNITY): Payer: Medicaid Other | Admitting: Anesthesiology

## 2011-08-31 ENCOUNTER — Inpatient Hospital Stay (HOSPITAL_COMMUNITY)
Admission: RE | Admit: 2011-08-31 | Discharge: 2011-09-02 | DRG: 765 | Disposition: A | Discharge status: Home / Self Care | Payer: Medicaid Other | Source: Ambulatory Visit | Attending: Obstetrics and Gynecology | Admitting: Obstetrics and Gynecology

## 2011-08-31 DIAGNOSIS — O26849 Uterine size-date discrepancy, unspecified trimester: Secondary | ICD-10-CM

## 2011-08-31 DIAGNOSIS — O09529 Supervision of elderly multigravida, unspecified trimester: Secondary | ICD-10-CM | POA: Diagnosis present

## 2011-08-31 DIAGNOSIS — Z302 Encounter for sterilization: Secondary | ICD-10-CM

## 2011-08-31 DIAGNOSIS — E669 Obesity, unspecified: Secondary | ICD-10-CM

## 2011-08-31 DIAGNOSIS — Z98891 History of uterine scar from previous surgery: Secondary | ICD-10-CM

## 2011-08-31 DIAGNOSIS — O2432 Unspecified pre-existing diabetes mellitus in childbirth: Secondary | ICD-10-CM | POA: Diagnosis present

## 2011-08-31 DIAGNOSIS — O34219 Maternal care for unspecified type scar from previous cesarean delivery: Secondary | ICD-10-CM

## 2011-08-31 DIAGNOSIS — O24119 Pre-existing diabetes mellitus, type 2, in pregnancy, unspecified trimester: Secondary | ICD-10-CM

## 2011-08-31 DIAGNOSIS — E119 Type 2 diabetes mellitus without complications: Secondary | ICD-10-CM | POA: Diagnosis present

## 2011-08-31 LAB — PREPARE RBC (CROSSMATCH)

## 2011-08-31 LAB — GLUCOSE, CAPILLARY: Glucose-Capillary: 78 mg/dL (ref 70–99)

## 2011-08-31 SURGERY — Surgical Case
Anesthesia: Spinal | Laterality: Bilateral | Wound class: Clean Contaminated

## 2011-08-31 MED ORDER — ONDANSETRON HCL 4 MG PO TABS: 4.0000 mg | ORAL_TABLET | ORAL | Status: DC | PRN

## 2011-08-31 MED ORDER — SENNOSIDES-DOCUSATE SODIUM 8.6-50 MG PO TABS
2.0000 | ORAL_TABLET | Freq: Every day | ORAL | Status: DC
  Administered 2011-08-31 – 2011-09-01 (×2): 2 via ORAL

## 2011-08-31 MED ORDER — ONDANSETRON HCL 4 MG/2ML IJ SOLN: 4.0000 mg | INTRAMUSCULAR | Status: DC | PRN

## 2011-08-31 MED ORDER — ONDANSETRON HCL 4 MG/2ML IJ SOLN
INTRAMUSCULAR | Status: AC
  Filled 2011-08-31: qty 2

## 2011-08-31 MED ORDER — SCOPOLAMINE 1 MG/3DAYS TD PT72: 1.0000 | MEDICATED_PATCH | Freq: Once | TRANSDERMAL | Status: DC

## 2011-08-31 MED ORDER — WITCH HAZEL-GLYCERIN EX PADS: 1.0000 "application " | MEDICATED_PAD | CUTANEOUS | Status: DC | PRN

## 2011-08-31 MED ORDER — OXYCODONE-ACETAMINOPHEN 5-325 MG PO TABS
1.0000 | ORAL_TABLET | ORAL | Status: DC | PRN
  Administered 2011-09-01 – 2011-09-02 (×5): 1 via ORAL
  Filled 2011-08-31 (×6): qty 1

## 2011-08-31 MED ORDER — LIDOCAINE-EPINEPHRINE (PF) 2 %-1:200000 IJ SOLN
INTRAMUSCULAR | Status: AC
  Filled 2011-08-31: qty 20

## 2011-08-31 MED ORDER — MEPERIDINE HCL 25 MG/ML IJ SOLN: 6.2500 mg | INTRAMUSCULAR | Status: DC | PRN

## 2011-08-31 MED ORDER — PHENYLEPHRINE 40 MCG/ML (10ML) SYRINGE FOR IV PUSH (FOR BLOOD PRESSURE SUPPORT)
PREFILLED_SYRINGE | INTRAVENOUS | Status: AC
  Filled 2011-08-31: qty 5

## 2011-08-31 MED ORDER — CEFAZOLIN SODIUM-DEXTROSE 2-3 GM-% IV SOLR
INTRAVENOUS | Status: AC
  Filled 2011-08-31: qty 50

## 2011-08-31 MED ORDER — MORPHINE SULFATE 0.5 MG/ML IJ SOLN
INTRAMUSCULAR | Status: AC
  Filled 2011-08-31: qty 10

## 2011-08-31 MED ORDER — SODIUM BICARBONATE 8.4 % IV SOLN
INTRAVENOUS | Status: DC | PRN
  Administered 2011-08-31: 5 mL via EPIDURAL

## 2011-08-31 MED ORDER — FENTANYL CITRATE 0.05 MG/ML IJ SOLN
INTRAMUSCULAR | Status: DC | PRN
  Administered 2011-08-31 (×2): 50 ug via INTRAVENOUS

## 2011-08-31 MED ORDER — CEFAZOLIN SODIUM-DEXTROSE 2-3 GM-% IV SOLR: 2.0000 g | INTRAVENOUS | Status: DC

## 2011-08-31 MED ORDER — SODIUM CHLORIDE 0.9 % IJ SOLN: 3.0000 mL | INTRAMUSCULAR | Status: DC | PRN

## 2011-08-31 MED ORDER — NALBUPHINE HCL 10 MG/ML IJ SOLN
5.0000 mg | INTRAMUSCULAR | Status: DC | PRN
  Administered 2011-08-31: 10 mg via SUBCUTANEOUS
  Filled 2011-08-31 (×3): qty 1

## 2011-08-31 MED ORDER — TETANUS-DIPHTH-ACELL PERTUSSIS 5-2.5-18.5 LF-MCG/0.5 IM SUSP
0.5000 mL | Freq: Once | INTRAMUSCULAR | Status: AC
  Administered 2011-09-01: 0.5 mL via INTRAMUSCULAR

## 2011-08-31 MED ORDER — METOCLOPRAMIDE HCL 5 MG/ML IJ SOLN: 10.0000 mg | Freq: Three times a day (TID) | INTRAMUSCULAR | Status: DC | PRN

## 2011-08-31 MED ORDER — KETOROLAC TROMETHAMINE 30 MG/ML IJ SOLN: 30.0000 mg | Freq: Four times a day (QID) | INTRAMUSCULAR | Status: AC | PRN

## 2011-08-31 MED ORDER — LACTATED RINGERS IV SOLN
INTRAVENOUS | Status: DC | PRN
  Administered 2011-08-31: 11:00:00 via INTRAVENOUS

## 2011-08-31 MED ORDER — OXYTOCIN 40 UNITS IN LACTATED RINGERS INFUSION - SIMPLE MED: 62.5000 mL/h | INTRAVENOUS | Status: AC

## 2011-08-31 MED ORDER — SCOPOLAMINE 1 MG/3DAYS TD PT72
MEDICATED_PATCH | TRANSDERMAL | Status: AC
  Administered 2011-08-31: 1.5 mg via TRANSDERMAL
  Filled 2011-08-31: qty 1

## 2011-08-31 MED ORDER — FENTANYL CITRATE 0.05 MG/ML IJ SOLN
INTRAMUSCULAR | Status: AC
  Filled 2011-08-31: qty 2

## 2011-08-31 MED ORDER — DIPHENHYDRAMINE HCL 25 MG PO CAPS
25.0000 mg | ORAL_CAPSULE | ORAL | Status: DC | PRN
  Filled 2011-08-31 (×2): qty 1

## 2011-08-31 MED ORDER — SIMETHICONE 80 MG PO CHEW
80.0000 mg | CHEWABLE_TABLET | Freq: Three times a day (TID) | ORAL | Status: DC
  Administered 2011-08-31 – 2011-09-02 (×6): 80 mg via ORAL

## 2011-08-31 MED ORDER — OXYTOCIN 10 UNIT/ML IJ SOLN
INTRAMUSCULAR | Status: AC
  Filled 2011-08-31: qty 4

## 2011-08-31 MED ORDER — PHENYLEPHRINE HCL 10 MG/ML IJ SOLN
INTRAMUSCULAR | Status: DC | PRN
  Administered 2011-08-31 (×2): 120 ug via INTRAVENOUS
  Administered 2011-08-31 (×3): 80 ug via INTRAVENOUS
  Administered 2011-08-31 (×2): 120 ug via INTRAVENOUS

## 2011-08-31 MED ORDER — SODIUM BICARBONATE 8.4 % IV SOLN
INTRAVENOUS | Status: AC
  Filled 2011-08-31: qty 50

## 2011-08-31 MED ORDER — ZOLPIDEM TARTRATE 5 MG PO TABS: 5.0000 mg | ORAL_TABLET | Freq: Every evening | ORAL | Status: DC | PRN

## 2011-08-31 MED ORDER — LACTATED RINGERS IV SOLN
INTRAVENOUS | Status: DC
  Administered 2011-09-01: 03:00:00 via INTRAVENOUS

## 2011-08-31 MED ORDER — NALOXONE HCL 0.4 MG/ML IJ SOLN: 0.4000 mg | INTRAMUSCULAR | Status: DC | PRN

## 2011-08-31 MED ORDER — 0.9 % SODIUM CHLORIDE (POUR BTL) OPTIME
TOPICAL | Status: DC | PRN
  Administered 2011-08-31: 1000 mL

## 2011-08-31 MED ORDER — SIMETHICONE 80 MG PO CHEW: 80.0000 mg | CHEWABLE_TABLET | ORAL | Status: DC | PRN

## 2011-08-31 MED ORDER — ONDANSETRON HCL 4 MG/2ML IJ SOLN: 4.0000 mg | Freq: Three times a day (TID) | INTRAMUSCULAR | Status: DC | PRN

## 2011-08-31 MED ORDER — FENTANYL CITRATE 0.05 MG/ML IJ SOLN: 25.0000 ug | INTRAMUSCULAR | Status: DC | PRN

## 2011-08-31 MED ORDER — SCOPOLAMINE 1 MG/3DAYS TD PT72
1.0000 | MEDICATED_PATCH | Freq: Once | TRANSDERMAL | Status: DC
  Administered 2011-08-31: 1.5 mg via TRANSDERMAL

## 2011-08-31 MED ORDER — DIPHENHYDRAMINE HCL 50 MG/ML IJ SOLN: 25.0000 mg | INTRAMUSCULAR | Status: DC | PRN

## 2011-08-31 MED ORDER — LANOLIN HYDROUS EX OINT: 1.0000 "application " | TOPICAL_OINTMENT | CUTANEOUS | Status: DC | PRN

## 2011-08-31 MED ORDER — PHENYLEPHRINE 40 MCG/ML (10ML) SYRINGE FOR IV PUSH (FOR BLOOD PRESSURE SUPPORT)
PREFILLED_SYRINGE | INTRAVENOUS | Status: AC
  Filled 2011-08-31: qty 10

## 2011-08-31 MED ORDER — PROMETHAZINE HCL 25 MG/ML IJ SOLN: 6.2500 mg | INTRAMUSCULAR | Status: DC | PRN

## 2011-08-31 MED ORDER — MORPHINE SULFATE (PF) 0.5 MG/ML IJ SOLN
INTRAMUSCULAR | Status: DC | PRN
  Administered 2011-08-31: 3000 ug via EPIDURAL
  Administered 2011-08-31: 2000 ug via INTRAVENOUS

## 2011-08-31 MED ORDER — ONDANSETRON HCL 4 MG/2ML IJ SOLN
INTRAMUSCULAR | Status: DC | PRN
  Administered 2011-08-31: 4 mg via INTRAVENOUS

## 2011-08-31 MED ORDER — LACTATED RINGERS IV SOLN
40.0000 [IU] | INTRAVENOUS | Status: DC | PRN
  Administered 2011-08-31: 40 [IU] via INTRAVENOUS

## 2011-08-31 MED ORDER — DIPHENHYDRAMINE HCL 25 MG PO CAPS
25.0000 mg | ORAL_CAPSULE | Freq: Four times a day (QID) | ORAL | Status: DC | PRN
  Administered 2011-09-01: 25 mg via ORAL

## 2011-08-31 MED ORDER — PRENATAL MULTIVITAMIN CH
1.0000 | ORAL_TABLET | Freq: Every day | ORAL | Status: DC
  Administered 2011-09-01 – 2011-09-02 (×2): 1 via ORAL
  Filled 2011-08-31 (×2): qty 1

## 2011-08-31 MED ORDER — DIBUCAINE 1 % RE OINT: 1.0000 "application " | TOPICAL_OINTMENT | RECTAL | Status: DC | PRN

## 2011-08-31 MED ORDER — NALBUPHINE HCL 10 MG/ML IJ SOLN
5.0000 mg | INTRAMUSCULAR | Status: DC | PRN
  Administered 2011-08-31: 10 mg via INTRAVENOUS
  Filled 2011-08-31: qty 1

## 2011-08-31 MED ORDER — MENTHOL 3 MG MT LOZG: 1.0000 | LOZENGE | OROMUCOSAL | Status: DC | PRN

## 2011-08-31 MED ORDER — ACETAMINOPHEN 10 MG/ML IV SOLN
1000.0000 mg | Freq: Four times a day (QID) | INTRAVENOUS | Status: AC | PRN
  Filled 2011-08-31: qty 100

## 2011-08-31 MED ORDER — MEPERIDINE HCL 25 MG/ML IJ SOLN
INTRAMUSCULAR | Status: AC
  Filled 2011-08-31: qty 1

## 2011-08-31 MED ORDER — LACTATED RINGERS IV SOLN
INTRAVENOUS | Status: DC
  Administered 2011-08-31 (×3): via INTRAVENOUS

## 2011-08-31 MED ORDER — SODIUM CHLORIDE 0.9 % IV SOLN
1.0000 ug/kg/h | INTRAVENOUS | Status: DC | PRN
  Filled 2011-08-31: qty 2.5

## 2011-08-31 MED ORDER — DIPHENHYDRAMINE HCL 50 MG/ML IJ SOLN: 12.5000 mg | INTRAMUSCULAR | Status: DC | PRN

## 2011-08-31 MED ORDER — KETOROLAC TROMETHAMINE 30 MG/ML IJ SOLN
INTRAMUSCULAR | Status: AC
  Filled 2011-08-31: qty 1

## 2011-08-31 MED ORDER — IBUPROFEN 600 MG PO TABS
600.0000 mg | ORAL_TABLET | Freq: Four times a day (QID) | ORAL | Status: DC
  Administered 2011-09-01 – 2011-09-02 (×6): 600 mg via ORAL
  Filled 2011-08-31 (×6): qty 1

## 2011-08-31 MED ORDER — MIDAZOLAM HCL 2 MG/2ML IJ SOLN: 0.5000 mg | Freq: Once | INTRAMUSCULAR | Status: DC | PRN

## 2011-08-31 SURGICAL SUPPLY — 31 items
BENZOIN TINCTURE PRP APPL 2/3 (GAUZE/BANDAGES/DRESSINGS) ×2 IMPLANT
CHLORAPREP W/TINT 26ML (MISCELLANEOUS) ×2 IMPLANT
CLOTH BEACON ORANGE TIMEOUT ST (SAFETY) ×2 IMPLANT
CONTAINER PREFILL 10% NBF 15ML (MISCELLANEOUS) IMPLANT
ELECT REM PT RETURN 9FT ADLT (ELECTROSURGICAL) ×2
ELECTRODE REM PT RTRN 9FT ADLT (ELECTROSURGICAL) ×1 IMPLANT
EXTRACTOR VACUUM M CUP 4 TUBE (SUCTIONS) IMPLANT
GLOVE BIO SURGEON STRL SZ7.5 (GLOVE) ×4 IMPLANT
GLOVE BIOGEL PI IND STRL 7.5 (GLOVE) ×1 IMPLANT
GLOVE BIOGEL PI INDICATOR 7.5 (GLOVE) ×1
GOWN PREVENTION PLUS LG XLONG (DISPOSABLE) ×6 IMPLANT
KIT ABG SYR 3ML LUER SLIP (SYRINGE) IMPLANT
NEEDLE HYPO 22GX1.5 SAFETY (NEEDLE) IMPLANT
NEEDLE HYPO 25X5/8 SAFETYGLIDE (NEEDLE) IMPLANT
NS IRRIG 1000ML POUR BTL (IV SOLUTION) ×2 IMPLANT
PACK C SECTION WH (CUSTOM PROCEDURE TRAY) ×2 IMPLANT
RETRACTOR WND ALEXIS 25 LRG (MISCELLANEOUS) ×1 IMPLANT
RTRCTR WOUND ALEXIS 25CM LRG (MISCELLANEOUS) ×2
SLEEVE SCD COMPRESS KNEE MED (MISCELLANEOUS) IMPLANT
STRIP CLOSURE SKIN 1/2X4 (GAUZE/BANDAGES/DRESSINGS) ×2 IMPLANT
SUT CHROMIC 2 0 CT 1 (SUTURE) ×2 IMPLANT
SUT MNCRL AB 3-0 PS2 27 (SUTURE) ×2 IMPLANT
SUT PLAIN 0 NONE (SUTURE) IMPLANT
SUT PLAIN 2 0 XLH (SUTURE) ×2 IMPLANT
SUT VIC AB 0 CT1 36 (SUTURE) ×2 IMPLANT
SUT VIC AB 0 CTX 36 (SUTURE) ×3
SUT VIC AB 0 CTX36XBRD ANBCTRL (SUTURE) ×3 IMPLANT
SYR CONTROL 10ML LL (SYRINGE) IMPLANT
TOWEL OR 17X24 6PK STRL BLUE (TOWEL DISPOSABLE) ×4 IMPLANT
TRAY FOLEY CATH 14FR (SET/KITS/TRAYS/PACK) ×2 IMPLANT
WATER STERILE IRR 1000ML POUR (IV SOLUTION) ×2 IMPLANT

## 2011-08-31 NOTE — Addendum Note (Signed)
Addendum  created 08/31/11 1432 by Izac Faulkenberry R Walker, CRNA   Modules edited:Anesthesia Medication Administration    

## 2011-08-31 NOTE — Anesthesia Procedure Notes (Signed)
Epidural Patient location during procedure: OB Start time: 08/31/2011 10:58 AM  Staffing Anesthesiologist: Brayton Caves R Performed by: anesthesiologist   Preanesthetic Checklist Completed: patient identified, site marked, surgical consent, pre-op evaluation, timeout performed, IV checked, risks and benefits discussed and monitors and equipment checked  Epidural Patient position: sitting Prep: site prepped and draped and DuraPrep Patient monitoring: continuous pulse ox and blood pressure Approach: midline Injection technique: LOR air and LOR saline  Needle:  Needle type: Tuohy  Needle gauge: 17 G Needle length: 9 cm Needle insertion depth: 8 cm Catheter type: closed end flexible Catheter size: 19 Gauge Catheter at skin depth: 13 cm Test dose: negative  Assessment Events: blood not aspirated, injection not painful, no injection resistance, negative IV test and no paresthesia  Additional Notes Patient identified.  Risk benefits discussed including failed block, incomplete pain control, headache, nerve damage, paralysis, blood pressure changes, nausea, vomiting, reactions to medication both toxic or allergic, and postpartum back pain.  Patient expressed understanding and wished to proceed.  All questions were answered.  Sterile technique used throughout procedure and epidural site dressed with sterile barrier dressing. No paresthesia or other complications noted.The patient did not experience any signs of intravascular injection such as tinnitus or metallic taste in mouth nor signs of intrathecal spread such as rapid motor block. Please see nursing notes for vital signs.

## 2011-08-31 NOTE — Anesthesia Preprocedure Evaluation (Addendum)
Anesthesia Evaluation  Patient identified by MRN, date of birth, ID band Patient awake    Reviewed: Allergy & Precautions, H&P , NPO status , Patient's Chart, lab work & pertinent test results  Airway Mallampati: III      Dental No notable dental hx.    Pulmonary neg pulmonary ROS,  breath sounds clear to auscultation  Pulmonary exam normal       Cardiovascular Exercise Tolerance: Good negative cardio ROS  Rhythm:regular Rate:Normal     Neuro/Psych negative neurological ROS  negative psych ROS   GI/Hepatic negative GI ROS, Neg liver ROS,   Endo/Other  negative endocrine ROSMorbid obesity  Renal/GU negative Renal ROS  negative genitourinary   Musculoskeletal   Abdominal Normal abdominal exam  (+)   Peds  Hematology negative hematology ROS (+)   Anesthesia Other Findings   Reproductive/Obstetrics (+) Pregnancy                           Anesthesia Physical Anesthesia Plan  ASA: III  Anesthesia Plan: Epidural   Post-op Pain Management:    Induction:   Airway Management Planned:   Additional Equipment:   Intra-op Plan:   Post-operative Plan:   Informed Consent: I have reviewed the patients History and Physical, chart, labs and discussed the procedure including the risks, benefits and alternatives for the proposed anesthesia with the patient or authorized representative who has indicated his/her understanding and acceptance.     Plan Discussed with: Anesthesiologist, CRNA and Surgeon  Anesthesia Plan Comments:        Anesthesia Quick Evaluation

## 2011-08-31 NOTE — Anesthesia Postprocedure Evaluation (Signed)
Anesthesia Post Note  Patient: [name]  Procedure(s) Performed: Procedure(s) (LRB): CESAREAN SECTION WITH BILATERAL TUBAL LIGATION (Bilateral)  Anesthesia type: Epidural  Patient location: PACU  Post pain: Pain level controlled  Post assessment: Post-op Vital signs reviewed  Last Vitals:  Filed Vitals:   08/31/11 1345  BP: 100/62  Pulse: 76  Temp: 36.9 C  Resp: 19    Post vital signs: Reviewed  Level of consciousness: awake  Complications: No apparent anesthesia complications

## 2011-08-31 NOTE — Transfer of Care (Signed)
Immediate Anesthesia Transfer of Care Note  Patient: [name]  Procedure(s) Performed: Procedure(s) (LRB): CESAREAN SECTION WITH BILATERAL TUBAL LIGATION (Bilateral)  Patient Location: PACU  Anesthesia Type: Epidural  Level of Consciousness: awake, alert  and oriented  Airway & Oxygen Therapy: Patient Spontanous Breathing  Post-op Assessment: Report given to PACU RN and Post -op Vital signs reviewed and stable  Post vital signs: Reviewed and stable  Complications: No apparent anesthesia complications

## 2011-08-31 NOTE — Op Note (Signed)
Cesarean Section Procedure Note  Indications: Repeat C-Section and Desires Permanent Sterilization  Pre-operative Diagnosis: Previous cesarean section, Desire for Permanent Sterilization   Post-operative Diagnosis: Previous cesarean section, Desire for Permanent Sterilization  Procedure: CESAREAN SECTION WITH BILATERAL TUBAL LIGATION  Surgeon: Purcell Nails, MD    Assistants: Lavera Guise, CNM  Anesthesia: Spinal  Anesthesiologist: Velna Hatchet, MD   Procedure Details  The patient was taken to the operating room after the risks, benefits, complications, treatment options, and expected outcomes were discussed with the patient.  The patient concurred with the proposed plan, giving informed consent which was signed and witnessed. The patient was taken to Operating Room C-Section Suite, identified as [name] and the procedure verified as C-Section Delivery. A Time Out was held and the above information confirmed.  After induction of anesthesia by obtaining a surgical level via the spinal, the patient was prepped and draped in the usual sterile manner. A Pfannenstiel skin incision was made and carried down through the subcutaneous tissue to the underlying layer of fascia.  The fascia was incised bilaterally and extended transversely bilaterally with the Mayo scissors. Kocher clamps were placed on the inferior aspect of the fascial incision and the underlying rectus muscle was separated from the fascia. The same was done on the superior aspect of the fascial incision.  The peritoneum was identified, entered bluntly and extended manually. The utero-vesical peritoneal reflection was incised transversely and the bladder flap was bluntly freed from the lower uterine segment. A low transverse uterine incision was made with the scalpel and extended bilaterally with the bandage scissors.  The infant was delivered in vertex position without difficulty. After the umbilical cord was  clamped and cut, the infant was handed to the awaiting pediatricians.  Cord blood was obtained for evaluation.  The placenta was removed intact and appeared to be within normal limits. The uterus was cleared of all clots and debris. The uterine incision was closed with running interlocking sutures of 0 Vicryl and a second imbricating layer was performed as well.   Bilateral tubes and ovaries appeared to be within normal limits.  Good hemostasis was noted.  Copious irrigation was performed until clear.  The uterus was exteriorized and the left fallopian tube grasped in the midportion with a babcock after carrying it out to its fimbriated end and ligated with two 2-0 plain ties.  The tube was excised and the remaining pedicle cauterized with the bovie. The same was done on the contralateral side.  The peritoneum was repaired with 2-0 chromic via a running suture.  The fascia was reapproximated with a running suture of 0 Vicryl. The subcutaneous tissue was reapproximated with 3 interrupted sutures of 2-0 plain.  The skin was reapproximated with a subcuticular suture of 3-0 monocryl.  Steristrips were applied.  Instrument, sponge, and needle counts were correct prior to abdominal closure and at the conclusion of the case.  The patient was awaiting transfer to the recovery room in good condition.  Findings: Live female infant with Apgars 9 at one minute and 9 at five minutes.  Normal appearing bilateral ovaries and fallopian tubes were noted.  Estimated Blood Loss:  900 ml         Drains: foley to gravity 100 cc         Total IV Fluids: 3800 ml         Specimens to Pathology: Placenta and Bilateral Portion of Tubes         Complications:  None; patient tolerated the procedure well.         Disposition: PACU - hemodynamically stable.         Condition: stable  Attending Attestation: I performed the procedure.

## 2011-08-31 NOTE — Addendum Note (Signed)
Addendum  created 08/31/11 1432 by Gertie Fey, CRNA   Modules edited:Anesthesia Medication Administration

## 2011-09-01 DIAGNOSIS — Z98891 History of uterine scar from previous surgery: Secondary | ICD-10-CM

## 2011-09-01 LAB — CBC
MCH: 27.7 pg (ref 26.0–34.0)
MCHC: 33 g/dL (ref 30.0–36.0)
MCV: 83.8 fL (ref 78.0–100.0)
Platelets: 210 10*3/uL (ref 150–400)
RDW: 14 % (ref 11.5–15.5)

## 2011-09-01 LAB — CCBB MATERNAL DONOR DRAW

## 2011-09-01 NOTE — Anesthesia Postprocedure Evaluation (Signed)
  Anesthesia Post-op Note  Patient: [name]  Procedure(s) Performed: Procedure(s) (LRB): CESAREAN SECTION WITH BILATERAL TUBAL LIGATION (Bilateral)  Patient Location: Mother/Baby  Anesthesia Type: Epidural  Level of Consciousness: awake  Airway and Oxygen Therapy: Patient Spontanous Breathing  Post-op Pain: mild  Post-op Assessment: Patient's Cardiovascular Status Stable and Respiratory Function Stable  Post-op Vital Signs: stable  Complications: No apparent anesthesia complications

## 2011-09-01 NOTE — Progress Notes (Signed)
Post Partum Day 1 s/p Csection and BTL  Subjective: no complaints, tolerating po, voiding without difficulty, plans in office circ  Objective: Blood pressure 99/66, pulse 84, temperature 98.3 F (36.8 C), temperature source Oral, resp. rate 18, weight 256 lb (116.121 kg), last menstrual period 11/26/2010, SpO2 97.00%, unknown if currently breastfeeding.  CBG (last 3)   Basename 09/01/11 1013 09/01/11 0627 08/31/11 2034  GLUCAP 107* 82 90     Physical Exam:  General: alert Lochia: appropriate Uterine Fundus: firm Incision: dressing c/d/i DVT Evaluation: No evidence of DVT seen on physical exam.   Basename 09/01/11 0530  HGB 10.9*  HCT 33.0*    Assessment/Plan: Doing well  Cont routine post op care CBGs are all good will discontinue checking and recheck at [facility] visit Questions answered Plans out pt circ   LOS: 1 day   Letanya Froh Y 09/01/2011, 1:18 PM

## 2011-09-01 NOTE — Addendum Note (Signed)
Addendum  created 09/01/11 1557 by Renford Dills, CRNA   Modules edited:Notes Section

## 2011-09-02 MED ORDER — IBUPROFEN 600 MG PO TABS: 600.0000 mg | ORAL_TABLET | Freq: Four times a day (QID) | ORAL | Status: AC | PRN

## 2011-09-02 MED ORDER — OXYCODONE-ACETAMINOPHEN 5-325 MG PO TABS: 1.0000 | ORAL_TABLET | ORAL | Status: AC | PRN

## 2011-09-02 NOTE — Discharge Summary (Signed)
Obstetric Discharge Summary Reason for Admission: cesarean section, repeat with BTL.  Hx  4 previous C/S.  Type 2 diabetes, on insulin during pregnancy Prenatal Procedures: NST and ultrasound Intrapartum Procedures: cesarean: low cervical, transverse Postpartum Procedures: none Complications-Operative and Postpartum: none Hemoglobin  Date Value Range Status  09/01/2011 10.9* 12.0 - 15.0 g/dL Final     HCT  Date Value Range Status  09/01/2011 33.0* 36.0 - 46.0 % Final    Patient was admitted on 08/31/11 for a scheduled repeat cesarean delivery.   She was taken to the operating room, where Dr. Su Hilt performed a repeat LTCS and BTL under spinal anesthesia, with delivery of a viable female, with weight and Apgars as listed below. Infant was in good condition and remained at the patient's bedside.  The patient was taken to recovery in good condition.  Patient planned to breast feed.  On post-op day 1, patient was doing well, tolerating a regular diet, with Hgb of 10.9.  Throughout her stay, her physical exam was WNL, her incision was CDI, and her vital signs remained stable.  By post-op day 2, she was up ad lib, tolerating a regular diet, with good pain control with po med.  She desired early d/c.  She was deemed to have received the full benefit of her hospital stay, and was discharged home in stable condition.  Her CBGs had remained WNL pp--plan made to check at [facility] visit.    Physical Exam:  General: alert Lochia: appropriate Uterine Fundus: firm Incision: healing well DVT Evaluation: No evidence of DVT seen on physical exam. Negative Homan's sign.  Discharge Diagnoses: Term Pregnancy-delivered and repeat LTCS, hx 4 previous C/S  Discharge Information: Date: 09/02/2011 Activity: Per [facility] handout Diet: routine Medications: Ibuprofen and Percocet Condition: stable Instructions: refer to practice specific booklet Discharge to: home Follow-up Information    Follow up with [facility] in 6 weeks. (Call  to schedule appointment and outpatient circumcision for Erskine Squibb)          Newborn Data: Live born female  Birth Weight: 9 lb 4.3 oz (4205 g) APGAR: 9, 9  Home with mother. Plan CBG check at pp visit.  Nigel Bridgeman 09/02/2011, 9:52 AM

## 2011-09-03 LAB — TYPE AND SCREEN: Unit division: 0

## 2011-09-03 NOTE — Progress Notes (Signed)
Post discharge chart review completed.  

## 2011-09-04 ENCOUNTER — Telehealth: Payer: Self-pay | Admitting: Obstetrics and Gynecology

## 2011-09-04 ENCOUNTER — Encounter: Payer: Self-pay | Admitting: Obstetrics and Gynecology

## 2011-09-04 ENCOUNTER — Ambulatory Visit (INDEPENDENT_AMBULATORY_CARE_PROVIDER_SITE_OTHER): Payer: Medicaid Other | Admitting: Obstetrics and Gynecology

## 2011-09-04 VITALS — BP 118/80 | Temp 98.4°F | Resp 16 | Ht 67.0 in | Wt 252.0 lb

## 2011-09-04 DIAGNOSIS — R609 Edema, unspecified: Secondary | ICD-10-CM | POA: Insufficient documentation

## 2011-09-04 MED ORDER — FUROSEMIDE 20 MG PO TABS: 20.0000 mg | ORAL_TABLET | Freq: Two times a day (BID) | ORAL | Status: DC

## 2011-09-04 NOTE — Telephone Encounter (Signed)
Tc to pt regarding msg, lm on vm to call back. 

## 2011-09-04 NOTE — Progress Notes (Signed)
Work in pt. Pt delivered 08/31/11 CS AR c/o tingling finger, numbness, & swelling in hands and feet.   S: Patient is complaining of swelling of hands and feet. Swelling in hands is causing her concern as unable to change diapers. O: D5  Post Op RLTCS      Neuro: Alert and orientated x 3      Lungs: CTAB      CV: RRR      Abdomen: soft and no tenderness all 4 quadrants      Incision site: dry and intact with no inflation.      Extremities: Upper limbs: hands very swelling and having discomfort making a fist.                          Lower limbs: +1 edema. Advised to elevate lower limbs when possible.      Patient complained of muscular type pain around chest  _ examined and c/w muscular strain possibly from delivery LTCS. A: Post partum swelling not associated with Hypertension. P. Tx Lasix 20mg  po daily x 2 days to assist with fluid shift.      Advised about Diuresis and need for potassium intake during this time. Advised to eat bananas or tomatoes.      F/u in 4 weeks or PRN.  Earl Gala, CNM.

## 2011-09-04 NOTE — Telephone Encounter (Signed)
Pt rtnd call, states delivered on 8/2, is now having swelling in hands and feet and numbness and tingling and is requesting an appt today.  Pt denies headache, visual changes, no issues with C/S incision.  Per DD, ok to work in on sched.  Pt appt @ 1100 today.

## 2011-09-05 ENCOUNTER — Telehealth: Payer: Self-pay | Admitting: Obstetrics and Gynecology

## 2011-09-05 NOTE — Telephone Encounter (Signed)
TC TO PT REGARDING MESSAGE. PT STATES THE RX SHE WAS GIVEN YESTERDAY DOES NOT WORK. ADVISED PT TO GIVE MED TIME TO WORK AND ADVISED PT TO KEEP DRINKING WATER AND TO EAT BANANAS OR TOMATOES AS ADVISED BY DENISE DAVIES YESTERDAY. INFORMED PT THAT IF SHE GETS NO RESULTS BY TOMORROW TO GIVE [place] A  CALL BACK. PT VOICED UNDERSTANDING.

## 2011-09-06 ENCOUNTER — Telehealth: Payer: Self-pay

## 2011-09-06 MED ORDER — HYDROCHLOROTHIAZIDE 25 MG PO TABS: ORAL_TABLET | ORAL | Status: DC

## 2011-09-06 NOTE — Telephone Encounter (Signed)
Tc from pt rgdg follow up on edema. Pt still c/o same amount of swelling in hands, ankles, and feet after completing Lasix pres x2days. C/o numbness in fingers. Consulted with ar, pt to try HCTZ 25mg  daily and call the office on 09/10/11 with report on how she is doing. Pt states,"will be coming to the office on 09/11/11 for son's circ and will discuss then". Informed pt to call office asap if sx's persist. Pt agrees.

## 2011-09-11 ENCOUNTER — Encounter: Payer: Medicaid Other | Admitting: Obstetrics and Gynecology

## 2011-09-11 ENCOUNTER — Telehealth: Payer: Self-pay

## 2011-09-11 NOTE — Telephone Encounter (Signed)
Follow up with pt rgdg edema. Pt states,"swelling has decreased in ankles, feet, and hands". Pt still having numbness in fingers,however declines referral to hand specialist per AR recs. Will make AR aware of pt's health status. Pt agrees and will continue HCTZ 25mg  daily as directed.

## 2011-10-09 ENCOUNTER — Encounter: Payer: Self-pay | Admitting: Obstetrics and Gynecology

## 2011-10-09 ENCOUNTER — Ambulatory Visit (INDEPENDENT_AMBULATORY_CARE_PROVIDER_SITE_OTHER): Payer: Medicaid Other | Admitting: Obstetrics and Gynecology

## 2011-10-09 VITALS — BP 108/60 | Temp 97.9°F | Wt 235.0 lb

## 2011-10-09 DIAGNOSIS — E119 Type 2 diabetes mellitus without complications: Secondary | ICD-10-CM

## 2011-10-09 NOTE — Progress Notes (Signed)
Date of delivery: 08/31/11 Female Name: [name] Vaginal delivery:no Cesarean section:yes Tubal ligation:yes GDM:yes Breast Feeding:yes Bottle Feeding:no Post-Partum Blues:yes Abnormal pap:no Normal GU function: yes Normal GI function:yes Returning to work:no EPDS: 7  No complaints  Filed Vitals:   10/09/11 1123  BP: 108/60  Temp: 97.9 F (36.6 C)   ROS: noncontributory  Well healed incisions  Pelvic exam:  VULVA: normal appearing vulva with no masses, tenderness or lesions,  VAGINA: normal appearing vagina with normal color and discharge, no lesions, CERVIX: normal appearing cervix without discharge or lesions,  UTERUS: uterus is normal size, shape, consistency and nontender,  ADNEXA: normal adnexa in size, nontender and no masses.   A/P Fasting glucose

## 2011-10-12 ENCOUNTER — Other Ambulatory Visit: Payer: Medicaid Other

## 2011-10-12 ENCOUNTER — Other Ambulatory Visit: Payer: Self-pay

## 2011-10-12 DIAGNOSIS — Z8632 Personal history of gestational diabetes: Secondary | ICD-10-CM

## 2011-10-13 LAB — GLUCOSE, FASTING: Glucose, Fasting: 88 mg/dL (ref 70–99)

## 2011-10-15 ENCOUNTER — Telehealth: Payer: Self-pay | Admitting: Obstetrics and Gynecology

## 2011-10-15 NOTE — Telephone Encounter (Signed)
Jackie/tst res

## 2011-10-17 ENCOUNTER — Telehealth: Payer: Self-pay

## 2011-10-17 NOTE — Telephone Encounter (Signed)
Returned pt call.  Pt was advised that fasting glucose level was normal. [name]

## 2011-10-17 NOTE — Telephone Encounter (Signed)
Spoke to pt re wnl fasting blood sugar = 88. I will let her know after AR reviews result if there are any instructions for pt. [name]

## 2011-10-20 ENCOUNTER — Inpatient Hospital Stay (HOSPITAL_COMMUNITY)
Admission: AD | Admit: 2011-10-20 | Discharge: 2011-10-20 | Disposition: A | Discharge status: Home / Self Care | Payer: Medicaid Other | Source: Ambulatory Visit | Attending: Obstetrics and Gynecology | Admitting: Obstetrics and Gynecology

## 2011-10-20 ENCOUNTER — Telehealth: Payer: Self-pay | Admitting: Obstetrics and Gynecology

## 2011-10-20 DIAGNOSIS — O909 Complication of the puerperium, unspecified: Secondary | ICD-10-CM | POA: Insufficient documentation

## 2011-10-20 NOTE — ED Notes (Signed)
[name] examined incision in triage. Small pinhole noted in incision. Scant  amount sero sag drainage on pad. Instructed pt to continue to monitor.

## 2011-10-20 NOTE — Telephone Encounter (Signed)
Come to [facility] for evaluation. Basha Krygier, CNM  

## 2011-10-20 NOTE — Progress Notes (Signed)
History  [name] is a 36 y.o. [id number] at Unknown   Subjective: Pt reports having some discharge from her incision for 8/2 c-section. Pt stated she had some bloody on the pad she put On. Some redness noted per pt daughter.         Chief Complaint  Patient presents with  . Wound Check   @SFHPI @  Vitals:  Blood pressure 118/68, pulse 74, temperature 98 F (36.7 C), temperature source Oral, resp. rate 18, height 5\' 6"  (1.676 m), currently breastfeeding. OB History    Grav Para Term Preterm Abortions TAB SAB Ect Mult Living   7 5 5  2 1 1   5       Past Medical History  Diagnosis Date  . Advanced maternal age in pregnancy   . Former smoker Quit when she found she was pregnant.  . Dehiscence of surgical wound     hx of  . Gestational diabetes   . H/O gonorrhea 1993  . H/O chlamydia infection   . H/O rubella   . H/O cystitis   . Complication of anesthesia 2004    Difficulty breathing  . Anemia     During pregnancy  . Vulvar rash 11/2004  . Psychosocial stressors   . Stress 2007  . Hx of constipation 2008    Past Surgical History  Procedure Date  . Cholecystectomy   . Cesarean section     x 4    Family History  Problem Relation Age of Onset  . Heart disease Mother   . COPD Mother   . Hypertension Father   . Hypertension Sister   . Kidney disease Sister     dialysis  . Asthma Daughter   . COPD Paternal Aunt     lung cancer  . Heart disease Maternal Grandmother   . Stroke Maternal Grandmother   . Hypertension Maternal Grandmother   . COPD Maternal Grandmother   . Depression Maternal Grandmother   . COPD Paternal Uncle     lung cancer    History  Substance Use Topics  . Smoking status: Former Games developer  . Smokeless tobacco: Never Used  . Alcohol Use: No    Allergies:  Allergies  Allergen Reactions  . Tape     Pt is sensitive to tape used in the hospital    Prescriptions prior to admission  Medication Sig Dispense Refill  . calcium  carbonate (TUMS EX) 750 MG chewable tablet Chew 1 tablet by mouth 2 (two) times daily as needed. heartburn      . clobetasol cream (TEMOVATE) 0.05 % Apply 1 application topically 2 (two) times daily. Back rash      . furosemide (LASIX) 20 MG tablet Take 1 tablet (20 mg total) by mouth 2 (two) times daily.  2 tablet  0  . hydrochlorothiazide (HYDRODIURIL) 25 MG tablet Pt to take 1 tablet by mouth daily.  30 tablet  0  . ibuprofen (ADVIL,MOTRIN) 600 MG tablet Take 600 mg by mouth every 6 (six) hours as needed.      Marland Kitchen oxiconazole (OXISTAT) 1 % lotion Apply 1 application topically 2 (two) times daily. To back rash      . Prenatal Vit-Fe Fumarate-FA (PRENATAL MULTIVITAMIN) TABS Take 1 tablet by mouth daily.        @ROS @ Physical Exam   Blood pressure 118/68, pulse 74, temperature 98 F (36.7 C), temperature source Oral, resp. rate 18, height 5\' 6"  (1.676 m), currently breastfeeding.  @PHYSEXAMBYAGE2 @ Labs:  No results found for this or any previous visit (from the past 24 hour(s)).  I Patient Active Problem List  Diagnosis  . Probable Pre-existing type 2 diabetes mellitus in pregnancy  . Advanced maternal age in pregnancy  . Former smoker  . Dehiscence of surgical wound  . H/O: C-section-plans repeat and BTL  . Obesity  . Uterine size date discrepancy, antepartum  . Gestational diabetes  . S/P cesarean section  . Swelling   Physical Examination:  General appearance - alert, well appearing, and in no distress and anxious abd soft, nt obese Incision well healed area towards center of incision  end of a ball point pen size circle superificial without redness or edema, scant sero sag drainage on ob pad.  ED Course  Assessment/Plan 7 week post Cesarean section Superficial wound P reviewed s/s to report to office swelling, pain, fever 100.4 drainage to report, use neosporin OTC bid. Collaboration with Dr. Normand Sloop at [facility].  Lavera Guise, CNM

## 2011-10-20 NOTE — MAU Note (Signed)
Pt reports having some discharge from her incision for 8/2 c-section. Pt stated she had some bloody on the pad she put  On.  Some redness noted per pt daughter.

## 2011-10-24 ENCOUNTER — Telehealth: Payer: Self-pay | Admitting: Obstetrics and Gynecology

## 2011-10-24 NOTE — Telephone Encounter (Signed)
Fax received for RF Ibuprofen.  Tc to pt.  States is not requesting RF. Not authorized.

## 2012-02-21 ENCOUNTER — Telehealth: Payer: Self-pay

## 2012-02-21 NOTE — Telephone Encounter (Signed)
Rx refill for PNV sent to [facility] [place], [place], [place]

## 2012-11-09 IMAGING — US US OB DETAIL+14 WK
2 series · 12 of 28 positions shown · non-contrast
Comparison: none

[Series 1: us ob detail +14 wk · 1 of 8 slices shown (1 of 2)]
[im 8/8]
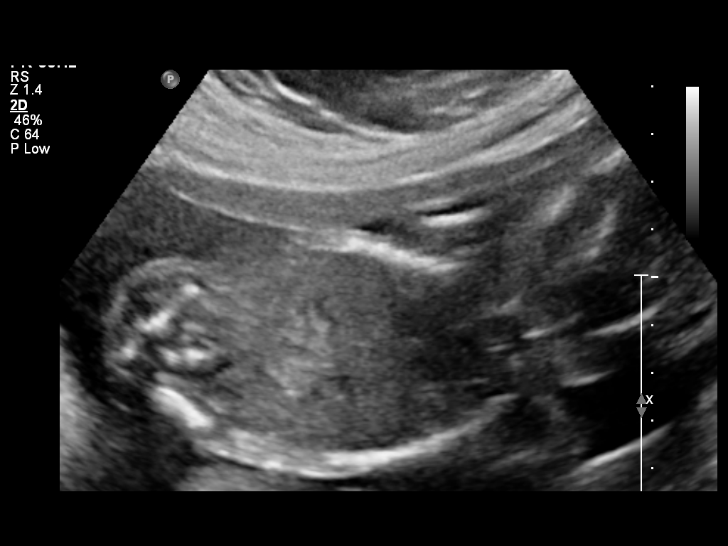

[Series 1: us ob detail +14 wk · 100 acquisitions, 11 frames shown (2 of 2)]
[im 4/100]
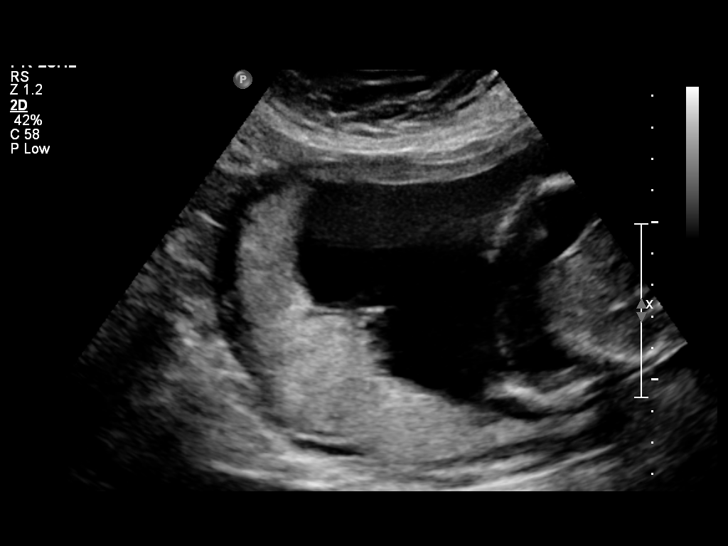
[im 12/100]
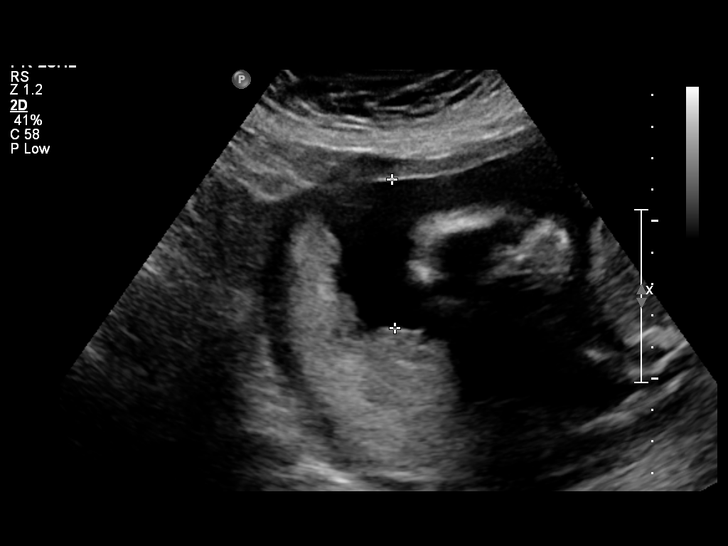
[im 24/100]
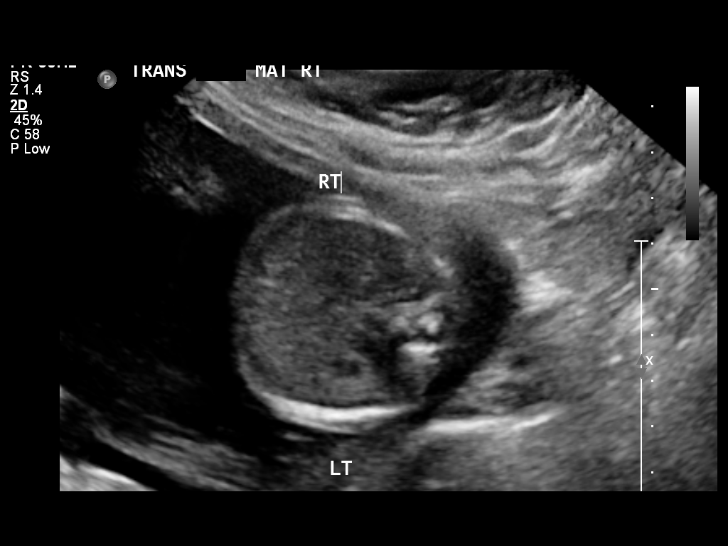
[im 32/100]
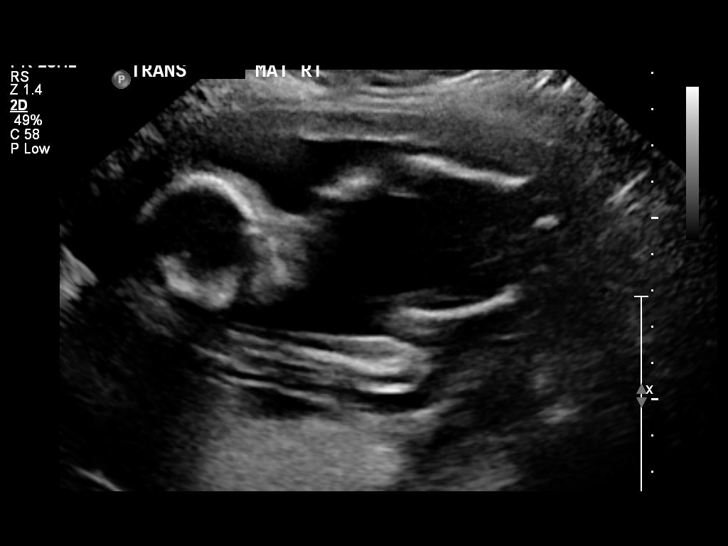
[im 40/100]
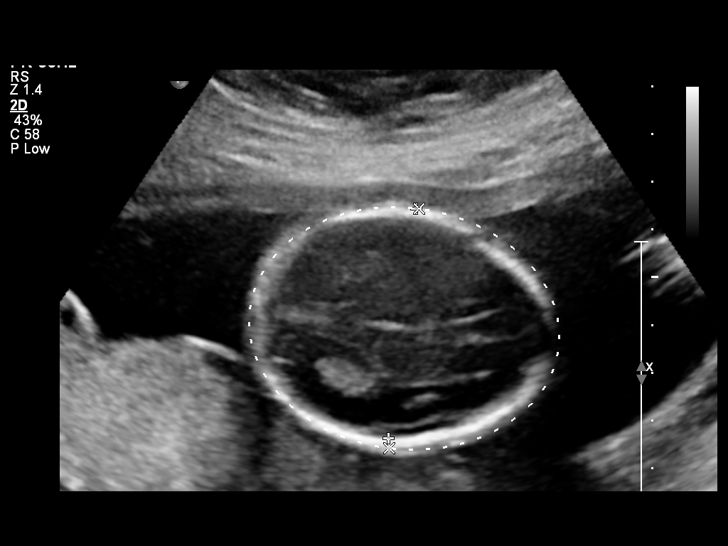
[im 52/100]
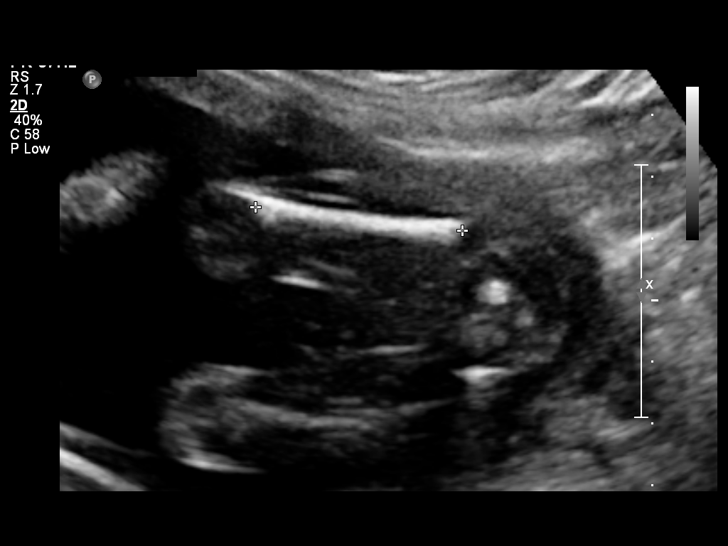
[im 60/100]
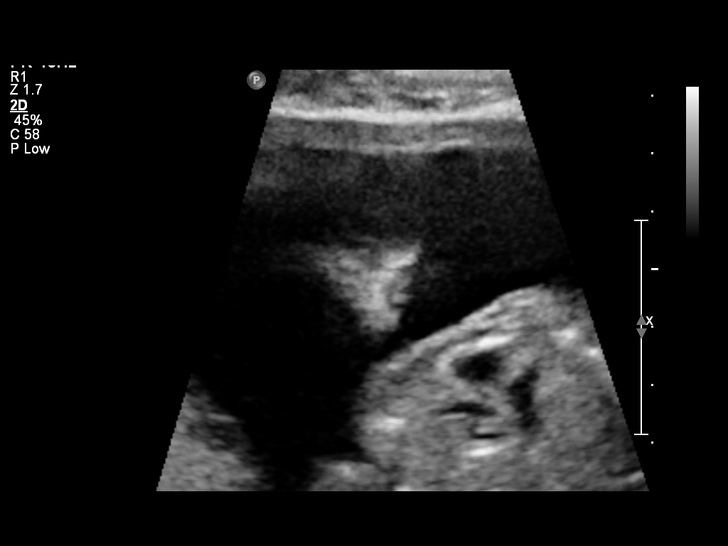
[im 68/100]
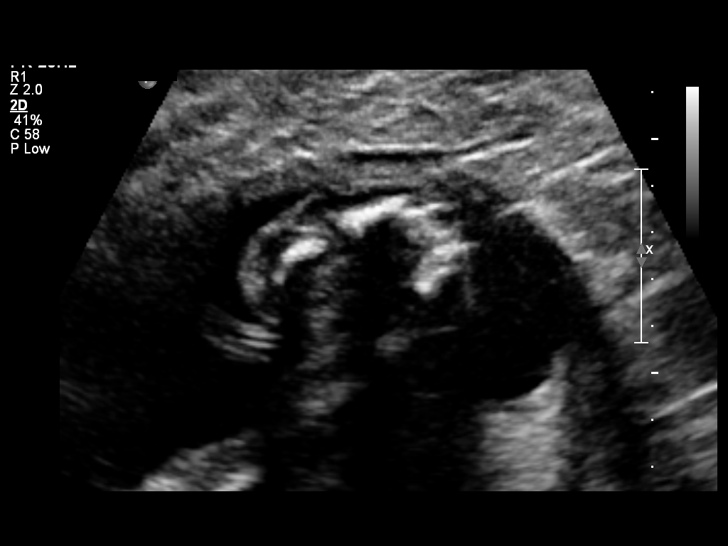
[im 80/100]
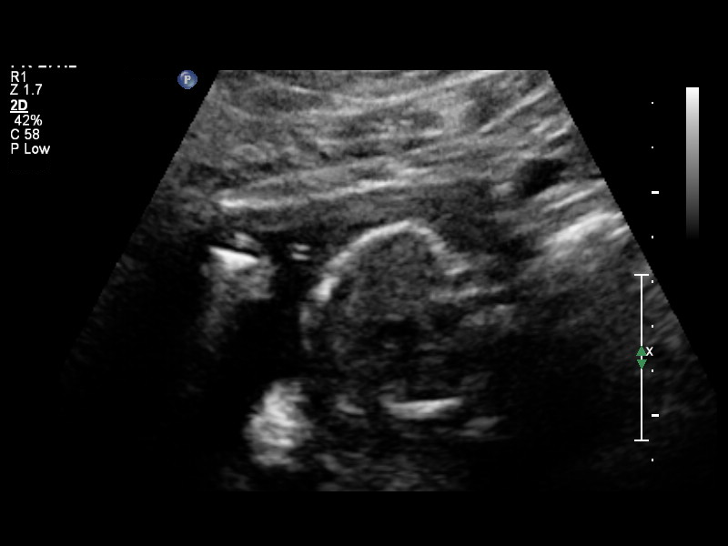
[im 88/100]
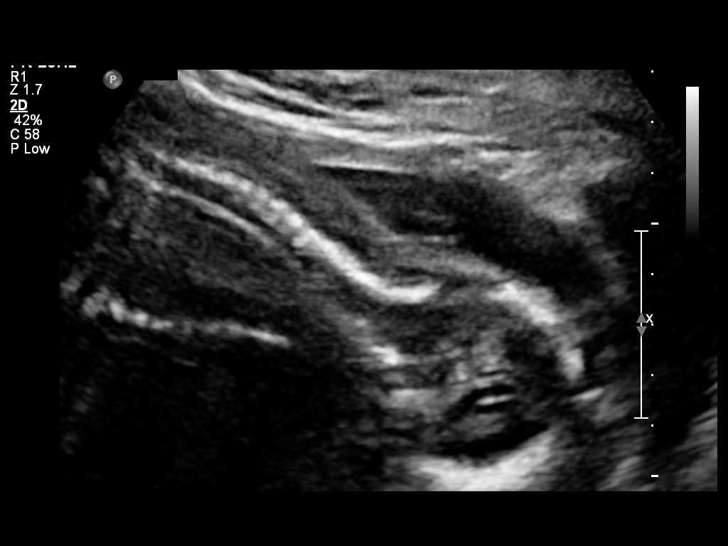
[im 96/100]
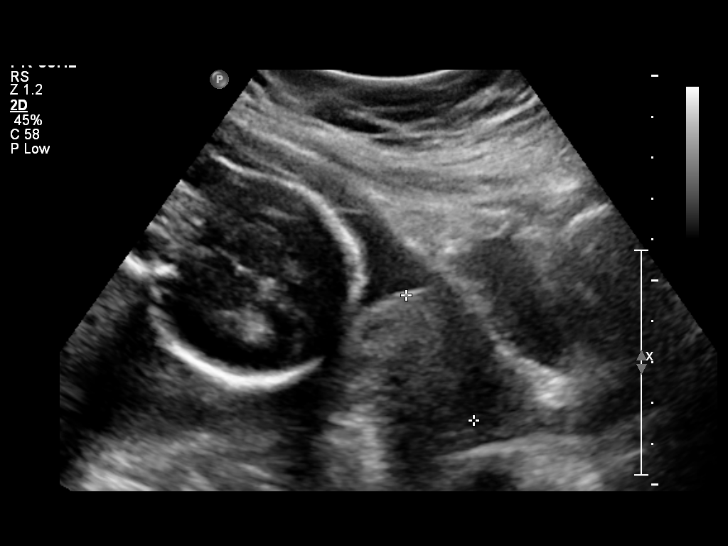

[12 of 28 positions shown; findings below may reference images not displayed]

OBSTETRICS REPORT
                      (Signed Final 04/20/2011 [DATE])

 Order#:         [id number]
Procedures

 US OB DETAIL + 14 WK                                  76811.0
Indications

 Detailed fetal anatomic survey
 Diabetes - Gestational
Fetal Evaluation

 Fetal Heart Rate:  149                         bpm
 Cardiac Activity:  Observed
 Presentation:      Variable
 Placenta:          Posterior Fundal, above
                    cervical os
 P. Cord            Appears WNL
 Insertion:

 Amniotic Fluid
 AFI FV:      Subjectively within normal limits
                                             Larg Pckt:     4.7  cm
Biometry

 BPD:     48.3  mm    G. Age:   20w 4d                CI:        71.02   70 - 86
                                                      FL/HC:      18.4   15.9 -

 HC:     182.6  mm    G. Age:   20w 5d       45  %    HC/AC:      1.11   1.06 -

 AC:       165  mm    G. Age:   21w 4d       75  %    FL/BPD:
 FL:      33.6  mm    G. Age:   20w 4d       41  %    FL/AC:      20.4   20 - 24
 HUM:     35.1  mm    G. Age:   22w 0d       88  %

 Est. FW:     394  gm    0 lb 14 oz      53  %
Gestational Age

 Clinical EDD:  20w 4d                                        EDD:   09/03/11
 U/S Today:     20w 6d                                        EDD:   09/01/11
 Best:          20w 4d    Det. By:   Clinical EDD             EDD:   09/03/11
Anatomy
 Cranium:           Appears normal      Aortic Arch:       Not well
                                                           visualized
 Fetal Cavum:       Appears normal      Ductal Arch:       Not well
                                                           visualized
 Ventricles:        Appears normal      Diaphragm:         Appears normal
 Choroid Plexus:    Appears normal      Stomach:           Appears
                                                           normal, left
                                                           sided
 Cerebellum:        Appears normal      Abdomen:           Appears normal
 Posterior Fossa:   Previously seen     Abdominal Wall:    Appears nml
                                                           (cord insert,
                                                           abd wall)
 Nuchal Fold:       Not applicable      Cord Vessels:      Appears normal
                    (>20 wks GA)                           (3 vessel cord)
 Face:              Appears normal      Kidneys:           Appear normal
                    (lips/profile/orbit
                    s)
 Heart:             Appears normal      Bladder:           Appears normal
                    (4 chamber &
                    axis)
 RVOT:              Appears normal      Spine:             Appears normal
 LVOT:              Appears normal      Limbs:             Appears normal
                                                           (hands, ankles,
                                                           feet)

 Other:     Fetus appears to be a male. Technically difficult due to
            maternal habitus and fetal position.
Cervix Uterus Adnexa

 Cervical Length:   4.1       cm

 Cervix:       Normal appearance by transabdominal scan.

 Left Ovary:   Within normal limits.
 Right Ovary:  Within normal limits.
 Adnexa:     No abnormality visualized.
Impression

 Siup demonstrating an EGA by ultrasound of 20w 6d. This
 corresponds well with expected EGA by clinical EDD of 20w
 4d.

 No focal fetal or placental abnormalities are noted with a
 good anatomic evaluation possible. No soft markers for Down
 Syndrome are seen. Given the expected age at delivery of
 36, today's normal ultrasound would decrease the age related
 risk for Down Syndrome from [DATE] to [DATE] (Megala et al).
 Correlation with other aneuploidy screening results, if
 available, would be recommended for a more complete risk
 assessment.

 Subjectively and quantitatively normal amniotic fluid volume.
 Normal cervical length.

## 2013-11-30 ENCOUNTER — Encounter: Payer: Self-pay | Admitting: Obstetrics and Gynecology

## 2016-01-04 ENCOUNTER — Emergency Department (HOSPITAL_COMMUNITY): Payer: Medicaid Other

## 2016-01-04 ENCOUNTER — Encounter (HOSPITAL_COMMUNITY): Payer: Self-pay | Admitting: Emergency Medicine

## 2016-01-04 ENCOUNTER — Emergency Department (HOSPITAL_COMMUNITY)
Admission: EM | Admit: 2016-01-04 | Discharge: 2016-01-04 | Disposition: A | Discharge status: Home / Self Care | Payer: Medicaid Other | Attending: Emergency Medicine | Admitting: Emergency Medicine

## 2016-01-04 DIAGNOSIS — R0789 Other chest pain: Secondary | ICD-10-CM | POA: Diagnosis present

## 2016-01-04 DIAGNOSIS — F17200 Nicotine dependence, unspecified, uncomplicated: Secondary | ICD-10-CM | POA: Insufficient documentation

## 2016-01-04 DIAGNOSIS — K0889 Other specified disorders of teeth and supporting structures: Secondary | ICD-10-CM | POA: Diagnosis not present

## 2016-01-04 DIAGNOSIS — R079 Chest pain, unspecified: Secondary | ICD-10-CM

## 2016-01-04 LAB — BASIC METABOLIC PANEL
Anion gap: 10 (ref 5–15)
BUN: 8 mg/dL (ref 6–20)
CALCIUM: 8.9 mg/dL (ref 8.9–10.3)
CO2: 22 mmol/L (ref 22–32)
CREATININE: 0.61 mg/dL (ref 0.44–1.00)
Chloride: 105 mmol/L (ref 101–111)
GFR calc Af Amer: >60 mL/min (ref >60)
GLUCOSE: 114 mg/dL — AB (ref 65–99)
Potassium: 4 mmol/L (ref 3.5–5.1)
SODIUM: 137 mmol/L (ref 135–145)

## 2016-01-04 LAB — CBC
HCT: 40.3 % (ref 36.0–46.0)
Hemoglobin: 13.5 g/dL (ref 12.0–15.0)
MCH: 27.4 pg (ref 26.0–34.0)
MCHC: 33.5 g/dL (ref 30.0–36.0)
MCV: 81.9 fL (ref 78.0–100.0)
PLATELETS: 316 10*3/uL (ref 150–400)
RBC: 4.92 MIL/uL (ref 3.87–5.11)
RDW: 13.9 % (ref 11.5–15.5)
WBC: 14.2 10*3/uL — AB (ref 4.0–10.5)

## 2016-01-04 LAB — I-STAT TROPONIN, ED: TROPONIN I, POC: 0 ng/mL (ref 0.00–0.08)

## 2016-01-04 MED ORDER — ACETAMINOPHEN 325 MG PO TABS
650.0000 mg | ORAL_TABLET | Freq: Once | ORAL | Status: AC
  Administered 2016-01-04: 650 mg via ORAL
  Filled 2016-01-04: qty 2

## 2016-01-04 MED ORDER — AMOXICILLIN 250 MG PO CAPS
250.0000 mg | ORAL_CAPSULE | Freq: Once | ORAL | Status: AC
  Administered 2016-01-04: 250 mg via ORAL
  Filled 2016-01-04: qty 1

## 2016-01-04 NOTE — ED Triage Notes (Signed)
Pt presents to ED for assessment of oral pain caused by "peridontal disease".  Pt sts she is currently taking an antibiotic for the infection.  Pt sts she was taking Doxycycline last week and is now taking Amoxicillin.  Pt is being treated by DDS.  Pt sts she felt like "all of this stuff is affecting my heart".  Pt sts she woke up this evening, felt flushed, had dental pain, went to take 2 tylenol and started to feel "weird" and dizzy, and pt began to have anxiety and began to cry and began to experience heaviness in the middle of her chest.

## 2016-01-04 NOTE — ED Provider Notes (Signed)
[facility]-EMERGENCY DEPT Provider Note   CSN: [id number] Arrival date & time: 01/04/16  0114     History   Chief Complaint Chief Complaint  Patient presents with  . Dental Pain  . Chest Pain    HPI [name] is a 40 y.o. female.  She is being treated for periodontal disease and has been having ongoing problems with abdominal pain which is worse on the left side. Tonight, she had a vague discomfort in her mid chest. There is no radiation of pain. There is no associated dyspnea, nausea, diaphoresis. Pain had resolved by the time she got to the ED. She is a cigarette smoker but denies hypertension, diabetes, hyperlipidemia. She did have gestational diabetes. There is family history of coronary artery disease but no history of premature coronary atherosclerosis.   The history is provided by the patient.  Dental Pain    Chest Pain      Past Medical History:  Diagnosis Date  . Advanced maternal age in pregnancy   . Anemia    During pregnancy  . Complication of anesthesia 2004   Difficulty breathing  . Dehiscence of surgical wound    hx of  . Former smoker Quit when she found she was pregnant.  . Gestational diabetes   . H/O chlamydia infection   . H/O cystitis   . H/O gonorrhea 1993  . H/O rubella   . Hx of constipation 2008  . Psychosocial stressors   . Stress 2007  . Vulvar rash 11/2004    Patient Active Problem List   Diagnosis Date Noted  . Swelling 09/04/2011  . S/P cesarean section 09/01/2011  . Gestational diabetes 08/09/2011  . Uterine size date discrepancy, antepartum 07/20/2011  . Obesity 07/13/2011  . H/O: C-section-plans repeat and BTL 07/06/2011  . Advanced maternal age in pregnancy   . Former smoker   . Dehiscence of surgical wound   . Probable Pre-existing type 2 diabetes mellitus in pregnancy 05/17/2011    Past Surgical History:  Procedure Laterality Date  . CESAREAN SECTION     x 4  . CHOLECYSTECTOMY      OB History    Gravida  Para Term Preterm AB Living   7 5 5   2 5    SAB TAB Ectopic Multiple Live Births   1 1     5        Home Medications    Prior to Admission medications   Medication Sig Start Date End Date Taking? Authorizing Provider  calcium carbonate (TUMS EX) 750 MG chewable tablet Chew 1 tablet by mouth 2 (two) times daily as needed. heartburn    Historical Provider, MD  clobetasol cream (TEMOVATE) 0.05 % Apply 1 application topically 2 (two) times daily. Back rash    Historical Provider, MD  furosemide (LASIX) 20 MG tablet Take 1 tablet (20 mg total) by mouth 2 (two) times daily. 09/04/11 09/03/12  Earl Gala, CNM  hydrochlorothiazide (HYDRODIURIL) 25 MG tablet Pt to take 1 tablet by mouth daily. 09/06/11   Hal Morales, MD  ibuprofen (ADVIL,MOTRIN) 600 MG tablet Take 600 mg by mouth every 6 (six) hours as needed.    Historical Provider, MD  oxiconazole (OXISTAT) 1 % lotion Apply 1 application topically 2 (two) times daily. To back rash    Historical Provider, MD  Prenatal Vit-Fe Fumarate-FA (PRENATAL MULTIVITAMIN) TABS Take 1 tablet by mouth daily.    Historical Provider, MD    Family History Family History  Problem Relation Age  of Onset  . Heart disease Mother   . COPD Mother   . Hypertension Father   . Hypertension Sister   . Kidney disease Sister     dialysis  . Asthma Daughter   . COPD Paternal Aunt     lung cancer  . Heart disease Maternal Grandmother   . Stroke Maternal Grandmother   . Hypertension Maternal Grandmother   . COPD Maternal Grandmother   . Depression Maternal Grandmother   . COPD Paternal Uncle     lung cancer    Social History Social History  Substance Use Topics  . Smoking status: Current Every Day Smoker    Packs/day: 1.00  . Smokeless tobacco: Never Used  . Alcohol use No     Allergies   Tape   Review of Systems Review of Systems  Cardiovascular: Positive for chest pain.  All other systems reviewed and are negative.    Physical Exam Updated  Vital Signs BP 142/84 (BP Location: Left Arm)   Pulse 70   Temp 98.6 F (37 C) (Oral)   Resp 18   Ht 5\' 6"  (1.676 m)   Wt 235 lb (106.6 kg)   LMP 01/01/2016   SpO2 99%   BMI 37.93 kg/m   Physical Exam  Nursing note and vitals reviewed.  40 year old female, resting comfortably and in no acute distress. Vital signs are normal. Oxygen saturation is 99%, which is normal. Head is normocephalic and atraumatic. PERRLA, EOMI. Oropharynx is clear. No obvious dental caries present. Neck is nontender and supple without adenopathy or JVD. Back is nontender and there is no CVA tenderness. Lungs are clear without rales, wheezes, or rhonchi. Chest is nontender. Heart has regular rate and rhythm without murmur. Abdomen is soft, flat, nontender without masses or hepatosplenomegaly and peristalsis is normoactive. Extremities have no cyanosis or edema, full range of motion is present. Skin is warm and dry without rash. Neurologic: Mental status is normal, cranial nerves are intact, there are no motor or sensory deficits. Psychiatric: Moderate emotional lability.  ED Treatments / Results  Labs (all labs ordered are listed, but only abnormal results are displayed) Labs Reviewed  BASIC METABOLIC PANEL - Abnormal; Notable for the following:       Result Value   Glucose, Bld 114 (*)    All other components within normal limits  CBC - Abnormal; Notable for the following:    WBC 14.2 (*)    All other components within normal limits  I-STAT TROPOININ, ED    EKG  EKG Interpretation  Date/Time:  Wednesday January 04 2016 01:19:15 EST Ventricular Rate:  73 PR Interval:  162 QRS Duration: 88 QT Interval:  382 QTC Calculation: 420 R Axis:   91 Text Interpretation:  Normal sinus rhythm Rightward axis Low voltage QRS Borderline ECG No old tracing to compare Confirmed by [facility]  MD, Kawana Hegel ([id number]) on 01/04/2016 1:23:42 AM       Radiology Dg Chest 2 View  Result Date: 01/04/2016 CLINICAL DATA:   Chest pressure, onset tonight. EXAM: CHEST  2 VIEW COMPARISON:  None. FINDINGS: The cardiomediastinal contours are normal. Streaky atelectasis or scarring in the right middle lobe. Pulmonary vasculature is normal. No consolidation, pleural effusion, or pneumothorax. No acute osseous abnormalities are seen. IMPRESSION: Streaky atelectasis or scarring in the right middle lobe. Electronically Signed   By: Rubye OaksMelanie  Ehinger M.D.   On: 01/04/2016 02:18    Procedures Procedures (including critical care time)  Medications Ordered in ED Medications  amoxicillin (AMOXIL) capsule 250 mg (not administered)     Initial Impression / Assessment and Plan / ED Course  I have reviewed the triage vital signs and the nursing notes.  Pertinent labs & imaging results that were available during my care of the patient were reviewed by me and considered in my medical decision making (see chart for details).  Clinical Course    Chest pain which is somewhat atypical. Heart Score = 1, which places her at extremely low risk of major adverse cardiac events in the next 30 days. She clearly needs to work with her dentist to improve her periodontal disease since that does increase her risk of cardiac event. She is referred to cardiology for outpatient stress testing. Old records were reviewed, and she has no relevant past visits.  Final Clinical Impressions(s) / ED Diagnoses   Final diagnoses:  Chest pain, unspecified type  Pain, dental    New Prescriptions New Prescriptions   No medications on file     Dione Boozeavid Ciel Yanes, MD 01/04/16 0505

## 2016-01-04 NOTE — Discharge Instructions (Signed)
Continue working with your dentist to fix your periodontal disease.

## 2016-02-03 NOTE — Progress Notes (Deleted)
HPI: 41 year old female for evaluation of chest pain. Seen 12/17 with chest pain. Troponin normal. Hemoglobin normal. WBC 14.2. Chest x-ray with right middle lobe scarring.  Current Outpatient Prescriptions  Medication Sig Dispense Refill  . calcium carbonate (TUMS EX) 750 MG chewable tablet Chew 1 tablet by mouth 2 (two) times daily as needed. heartburn    . clobetasol cream (TEMOVATE) 0.05 % Apply 1 application topically 2 (two) times daily. Back rash    . furosemide (LASIX) 20 MG tablet Take 1 tablet (20 mg total) by mouth 2 (two) times daily. 2 tablet 0  . hydrochlorothiazide (HYDRODIURIL) 25 MG tablet Pt to take 1 tablet by mouth daily. 30 tablet 0  . ibuprofen (ADVIL,MOTRIN) 600 MG tablet Take 600 mg by mouth every 6 (six) hours as needed.    Marland Kitchen oxiconazole (OXISTAT) 1 % lotion Apply 1 application topically 2 (two) times daily. To back rash    . Prenatal Vit-Fe Fumarate-FA (PRENATAL MULTIVITAMIN) TABS Take 1 tablet by mouth daily.     No current facility-administered medications for this visit.     Allergies  Allergen Reactions  . Other Rash    Pt is sensitive to tape used in the hospital  . Tape     Pt is sensitive to tape used in the hospital    Past Medical History:  Diagnosis Date  . Advanced maternal age in pregnancy   . Anemia    During pregnancy  . Complication of anesthesia 2004   Difficulty breathing  . Dehiscence of surgical wound    hx of  . Former smoker Quit when she found she was pregnant.  . Gestational diabetes   . H/O chlamydia infection   . H/O cystitis   . H/O gonorrhea 1993  . H/O rubella   . Hx of constipation 2008  . Psychosocial stressors   . Stress 2007  . Vulvar rash 11/2004    Past Surgical History:  Procedure Laterality Date  . CESAREAN SECTION     x 4  . CHOLECYSTECTOMY      Social History   Social History  . Marital status: Married    Spouse name: N/A  . Number of children: N/A  . Years of education: N/A    Occupational History  . Not on file.   Social History Main Topics  . Smoking status: Current Every Day Smoker    Packs/day: 1.00  . Smokeless tobacco: Never Used  . Alcohol use No  . Drug use: No  . Sexual activity: Not Currently    Birth control/ protection: Surgical     Comment: BTL   Other Topics Concern  . Not on file   Social History Narrative  . No narrative on file    Family History  Problem Relation Age of Onset  . Heart disease Mother   . COPD Mother   . Hypertension Father   . Hypertension Sister   . Kidney disease Sister     dialysis  . Asthma Daughter   . COPD Paternal Aunt     lung cancer  . Heart disease Maternal Grandmother   . Stroke Maternal Grandmother   . Hypertension Maternal Grandmother   . COPD Maternal Grandmother   . Depression Maternal Grandmother   . COPD Paternal Uncle     lung cancer    ROS: no fevers or chills, productive cough, hemoptysis, dysphasia, odynophagia, melena, hematochezia, dysuria, hematuria, rash, seizure activity, orthopnea, PND, pedal edema, claudication. Remaining systems are negative.  Physical Exam:   currently breastfeeding.  General:  Well developed/well nourished in NAD Skin warm/dry Patient not depressed No peripheral clubbing Back-normal HEENT-normal/normal eyelids Neck supple/normal carotid upstroke bilaterally; no bruits; no JVD; no thyromegaly chest - CTA/ normal expansion CV - RRR/normal S1 and S2; no murmurs, rubs or gallops;  PMI nondisplaced Abdomen -NT/ND, no HSM, no mass, + bowel sounds, no bruit 2+ femoral pulses, no bruits Ext-no edema, chords, 2+ DP Neuro-grossly nonfocal  ECG 01/04/2016-sinus rhythm, right axis deviation.

## 2016-02-06 ENCOUNTER — Encounter: Payer: Self-pay | Admitting: Internal Medicine

## 2016-02-06 ENCOUNTER — Ambulatory Visit (INDEPENDENT_AMBULATORY_CARE_PROVIDER_SITE_OTHER): Payer: Medicaid Other | Admitting: Internal Medicine

## 2016-02-06 VITALS — BP 132/86 | HR 70 | Ht 66.0 in | Wt 236.6 lb

## 2016-02-06 DIAGNOSIS — R0602 Shortness of breath: Secondary | ICD-10-CM | POA: Diagnosis not present

## 2016-02-06 DIAGNOSIS — R079 Chest pain, unspecified: Secondary | ICD-10-CM | POA: Diagnosis not present

## 2016-02-06 DIAGNOSIS — Z6834 Body mass index (BMI) 34.0-34.9, adult: Secondary | ICD-10-CM

## 2016-02-06 DIAGNOSIS — F17200 Nicotine dependence, unspecified, uncomplicated: Secondary | ICD-10-CM | POA: Diagnosis not present

## 2016-02-06 DIAGNOSIS — Z8249 Family history of ischemic heart disease and other diseases of the circulatory system: Secondary | ICD-10-CM

## 2016-02-06 DIAGNOSIS — E669 Obesity, unspecified: Secondary | ICD-10-CM

## 2016-02-06 MED ORDER — VARENICLINE TARTRATE 1 MG PO TABS: 1.0000 mg | ORAL_TABLET | Freq: Two times a day (BID) | ORAL | 1 refills | Status: DC

## 2016-02-06 MED ORDER — VARENICLINE TARTRATE 0.5 MG X 11 & 1 MG X 42 PO MISC: ORAL | 0 refills | Status: DC

## 2016-02-06 MED ORDER — VARENICLINE TARTRATE 0.5 MG X 11 & 1 MG X 42 PO MISC: ORAL | 0 refills | Status: AC

## 2016-02-06 NOTE — Progress Notes (Signed)
OFFICE NOTE  Chief Complaint:  Chest pain  Primary Care Physician: Ninetta LightsFURR,SARA, MD  HPI:  [name] is a 41 y.o. female who recently was seen in the emergency department on 01/04/2016 for chest pain. She is seen in consultation today for this. She's been having problems with periodontal disease and is undergoing treatment for that. She is a smoker and has coronary risk factors including obesity and a family history of premature coronary disease in her mother who died of heart failure at age 41. She denies being diabetic although has elevated triglycerides. She recently is change her diet and started exercising. She says that she does not have any recurrent chest pain although is short of breath with exertion. She does get some wheezing, mostly at night. She smokes about one pack per day. She is interested in smoking cessation and asked about Chantix today.  PMHx:  Past Medical History:  Diagnosis Date  . Advanced maternal age in pregnancy   . Anemia    During pregnancy  . Complication of anesthesia 2004   Difficulty breathing  . Dehiscence of surgical wound    hx of  . Former smoker Quit when she found she was pregnant.  . Gestational diabetes   . H/O chlamydia infection   . H/O cystitis   . H/O gonorrhea 1993  . H/O rubella   . Hx of constipation 2008  . Psychosocial stressors   . Stress 2007  . Vulvar rash 11/2004    Past Surgical History:  Procedure Laterality Date  . CESAREAN SECTION     x 4  . CHOLECYSTECTOMY      FAMHx:  Family History  Problem Relation Age of Onset  . Heart disease Mother   . COPD Mother   . Hypertension Father   . Hypertension Sister   . Kidney disease Sister     dialysis  . Asthma Daughter   . COPD Paternal Aunt     lung cancer  . Heart disease Maternal Grandmother   . Stroke Maternal Grandmother   . Hypertension Maternal Grandmother   . COPD Maternal Grandmother   . Depression Maternal Grandmother   . COPD Paternal  Uncle     lung cancer    SOCHx:   reports that she has been smoking.  She has been smoking about 1.00 pack per day. She has never used smokeless tobacco. She reports that she does not drink alcohol or use drugs.  ALLERGIES:  Allergies  Allergen Reactions  . Other Rash    Pt is sensitive to tape used in the hospital  . Tape     Pt is sensitive to tape used in the hospital    ROS: Pertinent items noted in HPI and remainder of comprehensive ROS otherwise negative.  HOME MEDS: No current outpatient prescriptions on file prior to visit.   No current facility-administered medications on file prior to visit.     LABS/IMAGING: No results found for this or any previous visit (from the past 48 hour(s)). No results found.  WEIGHTS: Wt Readings from Last 3 Encounters:  02/06/16 206 lb 9.6 oz (93.7 kg)  01/04/16 235 lb (106.6 kg)  10/09/11 235 lb (106.6 kg)    VITALS: BP 132/86 (BP Location: Left Arm, Patient Position: Sitting, Cuff Size: Normal)   Pulse 70   Ht 5\' 6"  (1.676 m)   Wt 206 lb 9.6 oz (93.7 kg)   BMI 33.35 kg/m   EXAM: General appearance: alert and no distress  Neck: no carotid bruit and no JVD Lungs: clear to auscultation bilaterally Heart: regular rate and rhythm Abdomen: soft, non-tender; bowel sounds normal; no masses,  no organomegaly and obese Extremities: extremities normal, atraumatic, no cyanosis or edema Pulses: 2+ and symmetric Skin: Skin color, texture, turgor normal. No rashes or lesions Neurologic: Grossly normal Psych: Pleasant  EKG: EKG from 01/04/16 ER visit personally reviewed. NSR at 73, rightward axis  ASSESSMENT: 1. Atypical chest pain 2. Moderate obesity 3. Dyspnea on exertion 4. Tobacco abuse  PLAN: 1.   [name] had chest discomfort without any significant recurrence however she does get short of breath with moderate exertion. Risk factors for coronary disease include moderate obesity, tobacco abuse and strong family history  of heart disease. I would recommend a treadmill exercise stress test prior to starting more vigorous physical activity. I agree with exercise and weight loss as she is trying. She is also interested in starting Chantix. I'll provide a starter pack and 2 refill packs. Follow-up with me afterwards.  Chrystie Nose, MD, [facility] Attending Cardiologist [facility]  Chrystie Nose 02/06/2016, 11:36 AM

## 2016-02-06 NOTE — Patient Instructions (Signed)
Your physician has requested that you have an exercise tolerance test. For further information please visit [email or web address]. Please also follow instruction sheet, as given.  Your physician has recommended you make the following change in your medication: START Chantix as directed  Your physician recommends that you schedule a follow-up appointment with Dr. Rennis GoldenHilty after your stress test

## 2016-02-08 ENCOUNTER — Telehealth: Payer: Self-pay | Admitting: Internal Medicine

## 2016-02-08 NOTE — Telephone Encounter (Signed)
That is fine with me .Marland Kitchen. I will contact her with the results of her treadmill stress test and if it is abnormal, she will need to be seen by either one of us.  Dr. HRexene Edison

## 2016-02-08 NOTE — Telephone Encounter (Signed)
Advised patient and scheduled follow up ov

## 2016-02-08 NOTE — Telephone Encounter (Signed)
Ok with me [name]  

## 2016-02-08 NOTE — Telephone Encounter (Signed)
Spoke with patient and advised would forward to Dr Rennis GoldenHilty and Dr Jens Somrenshaw She wanted to make sure Dr Rennis GoldenHilty was aware it is nothing personal she just wanted to see Dr Genice Rougerensahw and that was who she was originally scheduled to see.

## 2016-02-08 NOTE — Telephone Encounter (Signed)
New Message   Pt wants to switch from Dr. Rennis GoldenHilty to Dr. Jens Somrenshaw. Pt said she was originally scheduled to see Dr. Jens Somrenshaw and was told wrong appt time, and ended up having to see Dr. Rennis GoldenHilty. Would like a call back

## 2016-02-09 ENCOUNTER — Ambulatory Visit: Payer: Medicaid Other | Admitting: Cardiology

## 2016-02-17 ENCOUNTER — Ambulatory Visit (HOSPITAL_COMMUNITY)
Admission: RE | Admit: 2016-02-17 | Discharge: 2016-02-17 | Disposition: A | Discharge status: Home / Self Care | Payer: Medicaid Other | Source: Ambulatory Visit | Attending: Cardiology | Admitting: Cardiology

## 2016-02-17 DIAGNOSIS — R079 Chest pain, unspecified: Secondary | ICD-10-CM | POA: Insufficient documentation

## 2016-02-17 DIAGNOSIS — Z8249 Family history of ischemic heart disease and other diseases of the circulatory system: Secondary | ICD-10-CM | POA: Diagnosis not present

## 2016-02-17 DIAGNOSIS — Z6834 Body mass index (BMI) 34.0-34.9, adult: Secondary | ICD-10-CM | POA: Diagnosis not present

## 2016-02-17 DIAGNOSIS — R0602 Shortness of breath: Secondary | ICD-10-CM

## 2016-02-17 DIAGNOSIS — F17200 Nicotine dependence, unspecified, uncomplicated: Secondary | ICD-10-CM | POA: Insufficient documentation

## 2016-02-17 DIAGNOSIS — E669 Obesity, unspecified: Secondary | ICD-10-CM | POA: Insufficient documentation

## 2016-02-17 LAB — EXERCISE TOLERANCE TEST
CHL CUP MPHR: 180 {beats}/min
CHL CUP RESTING HR STRESS: 69 {beats}/min
CHL RATE OF PERCEIVED EXERTION: 17
Estimated workload: 10.1 METS
Exercise duration (min): 8 min
Exercise duration (sec): 1 s
Peak HR: 160 {beats}/min
Percent HR: 88 %

## 2016-02-22 NOTE — Progress Notes (Signed)
HPI: FU Chest pain; previously followed by Dr Rennis GoldenHilty. Patient seen in the emergency room December 6 with chest pain. ECG NSR, no ST changes. Hemoglobin normal. Troponin negative. Chest x-ray with atelectasis. White blood cell count 14.2. Exercise treadmill January 2018 showed exercise duration 8 minutes and 1 second with no chest pain and no ST changes. Patient has had no chest pain since December 6. She has dyspnea with more extreme activities but not routine activities. There is no orthopnea, PND or pedal edema. She does not have exertional chest pain.  Current Outpatient Prescriptions  Medication Sig Dispense Refill  . BIOTIN PO Take 1 capsule by mouth daily.    . Cyanocobalamin (VITAMIN B-12 PO) Take 1 tablet by mouth daily.    . ERGOCALCIFEROL PO Take 1 tablet by mouth daily.    . folic acid (FOLVITE) 1 MG tablet Take 1 mg by mouth daily.    . Pyridoxine HCl (VITAMIN B-6 PO) Take 1 tablet by mouth daily.    . varenicline (CHANTIX STARTING MONTH PAK) 0.5 MG X 11 & 1 MG X 42 tablet Take by mouth as directed. 53 tablet 0   No current facility-administered medications for this visit.      Past Medical History:  Diagnosis Date  . Advanced maternal age in pregnancy   . Anemia    During pregnancy  . Complication of anesthesia 2004   Difficulty breathing  . Dehiscence of surgical wound    hx of  . Former smoker Quit when she found she was pregnant.  . Gestational diabetes   . H/O chlamydia infection   . H/O cystitis   . H/O gonorrhea 1993  . H/O rubella   . Hx of constipation 2008  . Psychosocial stressors   . Stress 2007  . Vulvar rash 11/2004    Past Surgical History:  Procedure Laterality Date  . CESAREAN SECTION     x 4  . CHOLECYSTECTOMY      Social History   Social History  . Marital status: Married    Spouse name: N/A  . Number of children: N/A  . Years of education: N/A   Occupational History  . Not on file.   Social History Main Topics  . Smoking  status: Current Every Day Smoker    Packs/day: 1.00  . Smokeless tobacco: Never Used  . Alcohol use No  . Drug use: No  . Sexual activity: Not Currently    Birth control/ protection: Surgical     Comment: BTL   Other Topics Concern  . Not on file   Social History Narrative  . No narrative on file    Family History  Problem Relation Age of Onset  . Heart disease Mother   . COPD Mother   . Hypertension Father   . Hypertension Sister   . Kidney disease Sister     dialysis  . Asthma Daughter   . COPD Paternal Aunt     lung cancer  . Heart disease Maternal Grandmother   . Stroke Maternal Grandmother   . Hypertension Maternal Grandmother   . COPD Maternal Grandmother   . Depression Maternal Grandmother   . COPD Paternal Uncle     lung cancer    ROS: no fevers or chills, productive cough, hemoptysis, dysphasia, odynophagia, melena, hematochezia, dysuria, hematuria, rash, seizure activity, orthopnea, PND, pedal edema, claudication. Remaining systems are negative.  Physical Exam: Well-developed obese in no acute distress.  Skin is warm and dry.  HEENT is normal.  Neck is supple. No bruit Chest is clear to auscultation with normal expansion.  Cardiovascular exam is regular rate and rhythm. No murmur Abdominal exam nontender or distended. No masses palpated. Extremities show no edema. neuro grossly intact   A/P  1 chest pain-symptoms were atypical. Enzymes were negative. Exercise treadmill normal. No recurrence. We will not pursue further evaluation. Not 15 min spent prior to pt arrival reviewing previous records.  2 elevated white blood cell count-mildly elevated in the emergency room. I have asked her to follow-up with primary care physician this issue.  3 tobacco abuse-patient counseled on discontinuing.  4 moderate obesity-we discussed the importance of exercise, diet and weight loss.  Olga Millers, MD

## 2016-02-23 ENCOUNTER — Ambulatory Visit: Payer: Medicaid Other | Admitting: Internal Medicine

## 2016-03-02 ENCOUNTER — Ambulatory Visit (INDEPENDENT_AMBULATORY_CARE_PROVIDER_SITE_OTHER): Payer: Medicaid Other | Admitting: Cardiology

## 2016-03-02 VITALS — BP 112/72 | HR 81 | Ht 66.0 in | Wt 231.2 lb

## 2016-03-02 DIAGNOSIS — R079 Chest pain, unspecified: Secondary | ICD-10-CM

## 2016-03-02 DIAGNOSIS — F17200 Nicotine dependence, unspecified, uncomplicated: Secondary | ICD-10-CM | POA: Diagnosis not present

## 2016-03-02 NOTE — Patient Instructions (Signed)
Your physician recommends that you schedule a follow-up appointment in: AS NEEDED  

## 2019-06-06 ENCOUNTER — Other Ambulatory Visit: Payer: Self-pay

## 2019-06-06 ENCOUNTER — Encounter (HOSPITAL_COMMUNITY): Payer: Self-pay | Admitting: Emergency Medicine

## 2019-06-06 ENCOUNTER — Emergency Department (HOSPITAL_COMMUNITY)
Admission: EM | Admit: 2019-06-06 | Discharge: 2019-06-06 | Disposition: A | Discharge status: Home / Self Care | Payer: 59 | Attending: Emergency Medicine | Admitting: Emergency Medicine

## 2019-06-06 DIAGNOSIS — Z79899 Other long term (current) drug therapy: Secondary | ICD-10-CM | POA: Diagnosis not present

## 2019-06-06 DIAGNOSIS — F17210 Nicotine dependence, cigarettes, uncomplicated: Secondary | ICD-10-CM | POA: Insufficient documentation

## 2019-06-06 DIAGNOSIS — M545 Low back pain: Secondary | ICD-10-CM | POA: Diagnosis present

## 2019-06-06 DIAGNOSIS — M62830 Muscle spasm of back: Secondary | ICD-10-CM | POA: Insufficient documentation

## 2019-06-06 DIAGNOSIS — M62838 Other muscle spasm: Secondary | ICD-10-CM

## 2019-06-06 MED ORDER — NAPROXEN 250 MG PO TABS
500.0000 mg | ORAL_TABLET | Freq: Once | ORAL | Status: AC
  Administered 2019-06-06: 09:00:00 500 mg via ORAL
  Filled 2019-06-06: qty 2

## 2019-06-06 MED ORDER — CYCLOBENZAPRINE HCL 10 MG PO TABS
10.0000 mg | ORAL_TABLET | Freq: Once | ORAL | Status: AC
  Administered 2019-06-06: 10 mg via ORAL
  Filled 2019-06-06: qty 1

## 2019-06-06 MED ORDER — LIDOCAINE 5 % EX PTCH
1.0000 | MEDICATED_PATCH | CUTANEOUS | Status: DC
  Administered 2019-06-06: 09:00:00 1 via TRANSDERMAL
  Filled 2019-06-06: qty 1

## 2019-06-06 MED ORDER — CYCLOBENZAPRINE HCL 10 MG PO TABS: 10.0000 mg | ORAL_TABLET | Freq: Two times a day (BID) | ORAL | 0 refills | Status: AC | PRN

## 2019-06-06 NOTE — ED Triage Notes (Addendum)
C/o L sided mid back pain/ "spasms" x 1 hour.  Pain worse with movement.  States pain started after getting in car this morning.  Denies urinary complaints.

## 2019-06-06 NOTE — ED Provider Notes (Signed)
[facility] EMERGENCY DEPARTMENT Provider Note   CSN: [id number] Arrival date & time: 06/06/19  0825     History Chief Complaint  Patient presents with  . Back Pain    [name] is a 44 y.o. female who presents with back pain. She states that she got in her car today without any problems and then while driving had an acute onset of L mid back pain which she describes as spasms. It feels like a burning pain at times. It's worse when she moves. Better when she rests. It comes and goes in waves. She was lifting 40 pound boxes on Thursday but didn't have any pain at that time. No fever, syncope, trauma, unexplained weight loss, hx of cancer, loss of bowel/bladder function, saddle anesthesia, urinary retention, IVDU.   HPI     Past Medical History:  Diagnosis Date  . Advanced maternal age in pregnancy   . Anemia    During pregnancy  . Complication of anesthesia 2004   Difficulty breathing  . Dehiscence of surgical wound    hx of  . Former smoker Quit when she found she was pregnant.  . Gestational diabetes   . H/O chlamydia infection   . H/O cystitis   . H/O gonorrhea 1993  . H/O rubella   . Hx of constipation 2008  . Psychosocial stressors   . Stress 2007  . Vulvar rash 11/2004    Patient Active Problem List   Diagnosis Date Noted  . Chest pain 02/06/2016  . Family history of heart disease 02/06/2016  . Short of breath on exertion 02/06/2016  . Smoker 02/06/2016  . Swelling 09/04/2011  . S/P cesarean section 09/01/2011  . Gestational diabetes 08/09/2011  . Uterine size date discrepancy, antepartum 07/20/2011  . Obesity 07/13/2011  . H/O: C-section-plans repeat and BTL 07/06/2011  . Advanced maternal age in pregnancy   . Former smoker   . Dehiscence of surgical wound   . Probable Pre-existing type 2 diabetes mellitus in pregnancy 05/17/2011    Past Surgical History:  Procedure Laterality Date  . CESAREAN SECTION     x 4  .  CHOLECYSTECTOMY       OB History    Gravida  7   Para  5   Term  5   Preterm      AB  2   Living  5     SAB  1   TAB  1   Ectopic      Multiple      Live Births  5           Family History  Problem Relation Age of Onset  . Heart disease Mother   . COPD Mother   . Hypertension Father   . Hypertension Sister   . Kidney disease Sister        dialysis  . Asthma Daughter   . COPD Paternal Aunt        lung cancer  . Heart disease Maternal Grandmother   . Stroke Maternal Grandmother   . Hypertension Maternal Grandmother   . COPD Maternal Grandmother   . Depression Maternal Grandmother   . COPD Paternal Uncle        lung cancer    Social History   Tobacco Use  . Smoking status: Current Every Day Smoker    Packs/day: 1.00  . Smokeless tobacco: Never Used  Substance Use Topics  . Alcohol use: No  . Drug use: No  Home Medications Prior to Admission medications   Medication Sig Start Date End Date Taking? Authorizing Provider  BIOTIN PO Take 1 capsule by mouth daily.    [provider]  Cyanocobalamin (VITAMIN B-12 PO) Take 1 tablet by mouth daily.    [provider]  ERGOCALCIFEROL PO Take 1 tablet by mouth daily.    [provider]  folic acid (FOLVITE) 1 MG tablet Take 1 mg by mouth daily.    [provider]  Pyridoxine HCl (VITAMIN B-6 PO) Take 1 tablet by mouth daily.    [provider]  varenicline (CHANTIX STARTING MONTH PAK) 0.5 MG X 11 & 1 MG X 42 tablet Take by mouth as directed. 02/06/16   Hilty, Nadean Corwin, MD    Allergies    Other and Tape  Review of Systems   Review of Systems  Respiratory: Negative for shortness of breath.   Cardiovascular: Negative for chest pain.  Gastrointestinal: Negative for abdominal pain.  Genitourinary: Negative for dysuria.  Musculoskeletal: Positive for back pain and myalgias.    Physical Exam Updated Vital Signs Ht 5\' 6"  (1.676 m)   Wt 98.4 kg   LMP  05/23/2019   BMI 35.02 kg/m   Physical Exam Vitals and nursing note reviewed.  Constitutional:      General: She is not in acute distress.    Appearance: Normal appearance. She is well-developed. She is not ill-appearing.     Comments: Uncomfortable appearing. NAD  HENT:     Head: Normocephalic and atraumatic.  Eyes:     General: No scleral icterus.       Right eye: No discharge.        Left eye: No discharge.     Conjunctiva/sclera: Conjunctivae normal.     Pupils: Pupils are equal, round, and reactive to light.  Cardiovascular:     Rate and Rhythm: Normal rate and regular rhythm.  Pulmonary:     Effort: Pulmonary effort is normal. No respiratory distress.     Breath sounds: Normal breath sounds.  Abdominal:     General: There is no distension.  Musculoskeletal:     Cervical back: Normal range of motion.     Comments: Back: Inspection: No masses, deformity, or rash Palpation: No midline spinal tenderness. No paraspinal muscle tenderness. Pt reports it feels better when I palpate the left mid back Strength: 5/5 in lower extremities and normal plantar and dorsiflexion  Skin:    General: Skin is warm and dry.  Neurological:     Mental Status: She is alert and oriented to person, place, and time.  Psychiatric:        Behavior: Behavior normal.     ED Results / Procedures / Treatments   Labs (all labs ordered are listed, but only abnormal results are displayed) Labs Reviewed - No data to display  EKG None  Radiology No results found.  Procedures Procedures (including critical care time)  Medications Ordered in ED Medications - No data to display  ED Course  I have reviewed the triage vital signs and the nursing notes.  Pertinent labs & imaging results that were available during my care of the patient were reviewed by me and considered in my medical decision making (see chart for details).  44 year old female presents with atraumatic left mid back pain and  spasms starting this morning.  On exam she is uncomfortable appearing.  She has no tenderness with palpation.  Pain is elicited with movement and improved with rest.  She did a lot of heavy lifting several days ago with which may be contributing to her pain.  There are no red flags in history on exam therefore imaging not clinically indicated today.  We will treat conservatively with NSAIDs, lidocaine patch, muscle relaxers.  She is given prescription for muscle relaxers and a work note.  MDM Rules/Calculators/A&P                      Final Clinical Impression(s) / ED Diagnoses Final diagnoses:  Muscle spasm    Rx / DC Orders ED Discharge Orders    None       Bethel Born, PA-C 06/06/19 0920    Rolan Bucco, MD 06/06/19 1207

## 2019-06-06 NOTE — Discharge Instructions (Signed)
Take Ibuprofen or Aleve as needed for the next week. Take this medicine with food. Take muscle relaxer at bedtime to help you sleep. This medicine makes you drowsy so do not take before driving or work If the lidocaine patch is helpful you can get more at the pharmacy over the counter Use a heating pad for sore muscles - use for 20 minutes several times a day Try gentle range of motion exercises Return for worsening symptoms

## 2020-05-16 ENCOUNTER — Other Ambulatory Visit: Payer: Self-pay | Admitting: Obstetrics and Gynecology

## 2020-06-01 ENCOUNTER — Other Ambulatory Visit: Payer: Self-pay | Admitting: Obstetrics and Gynecology

## 2020-06-01 DIAGNOSIS — R5381 Other malaise: Secondary | ICD-10-CM

## 2020-06-06 ENCOUNTER — Other Ambulatory Visit: Payer: Self-pay | Admitting: Obstetrics and Gynecology

## 2020-06-06 DIAGNOSIS — R928 Other abnormal and inconclusive findings on diagnostic imaging of breast: Secondary | ICD-10-CM

## 2020-06-07 ENCOUNTER — Other Ambulatory Visit: Payer: Self-pay | Admitting: Obstetrics and Gynecology

## 2020-06-07 DIAGNOSIS — N6489 Other specified disorders of breast: Secondary | ICD-10-CM

## 2020-06-15 ENCOUNTER — Ambulatory Visit
Admission: RE | Admit: 2020-06-15 | Discharge: 2020-06-15 | Disposition: A | Discharge status: Home / Self Care | Payer: 59 | Source: Ambulatory Visit | Attending: Obstetrics and Gynecology | Admitting: Obstetrics and Gynecology

## 2020-06-15 ENCOUNTER — Other Ambulatory Visit: Payer: Self-pay

## 2020-06-15 DIAGNOSIS — N6489 Other specified disorders of breast: Secondary | ICD-10-CM

## 2020-07-13 ENCOUNTER — Other Ambulatory Visit: Payer: 59

## 2022-01-05 IMAGING — MG DIGITAL DIAGNOSTIC BILAT W/ TOMO W/ CAD
8 of 15 series · 8 of 40 positions shown · non-contrast
Comparison: Previous exam(s).

CLINICAL DATA: Delayed followup for probably benign asymmetry in
the LEFT breast.

EXAM:
DIGITAL DIAGNOSTIC BILATERAL MAMMOGRAM WITH TOMOSYNTHESIS AND CAD;
ULTRASOUND LEFT BREAST LIMITED
TECHNIQUE: Bilateral digital diagnostic mammography and breast tomosynthesis
was performed. The images were evaluated with computer-aided
detection.; Targeted ultrasound examination of the left breast was
performed

[L CC synth-2D]
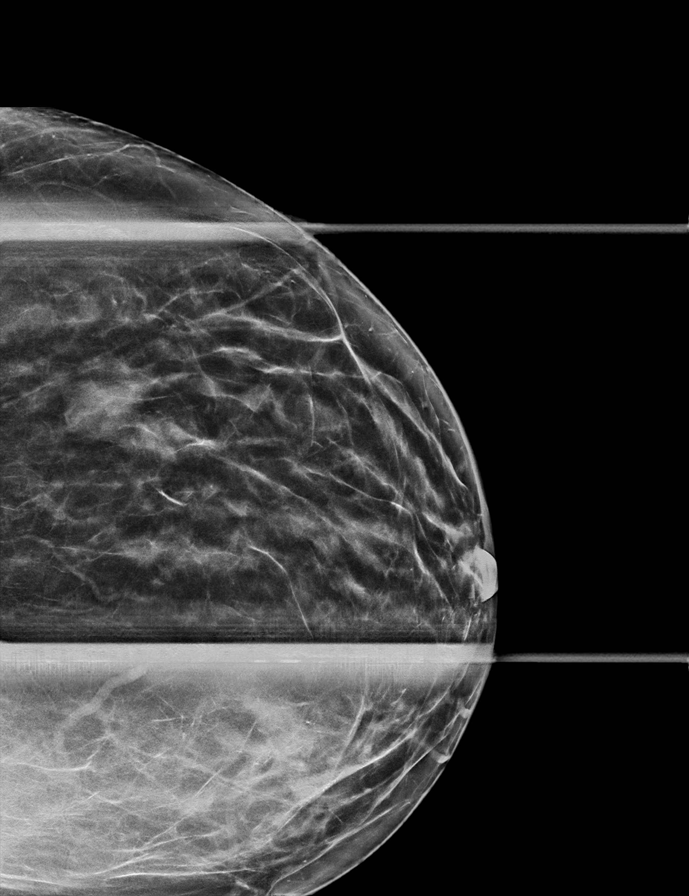

[L MLO synth-2D (1 of 4)]
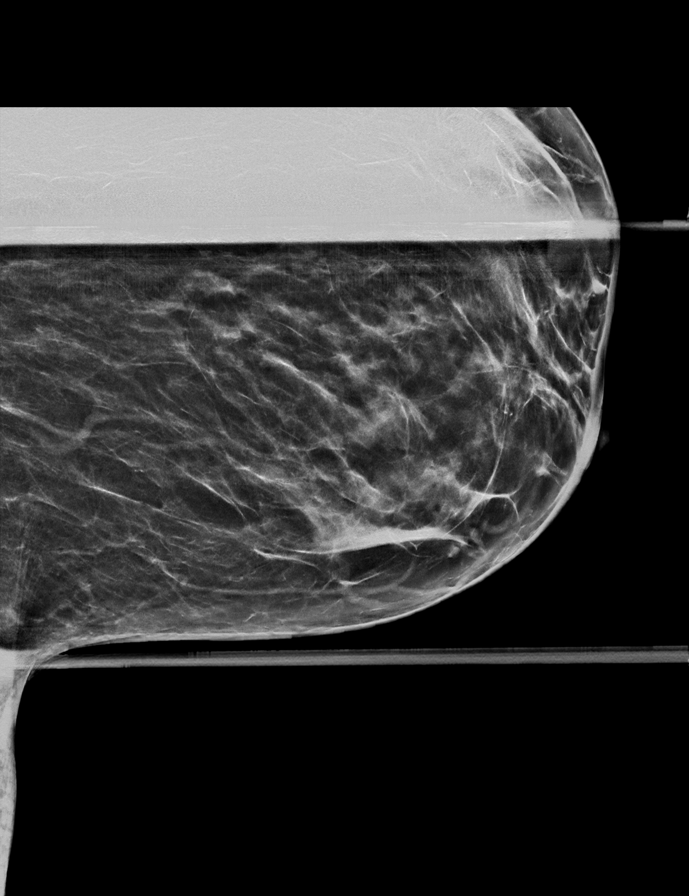

[R CC synth-2D]
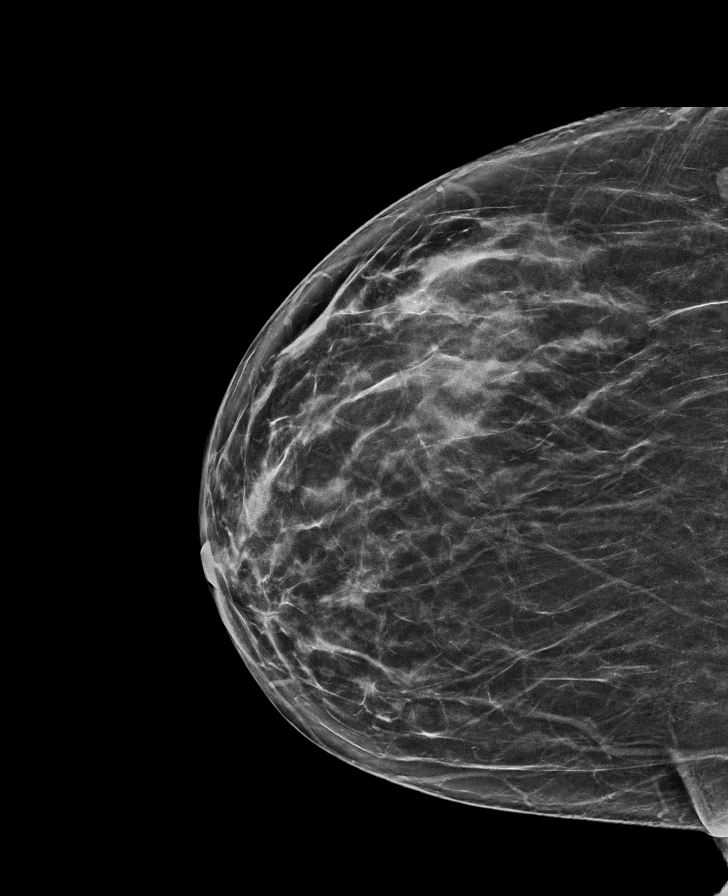

[R MLO synth-2D]
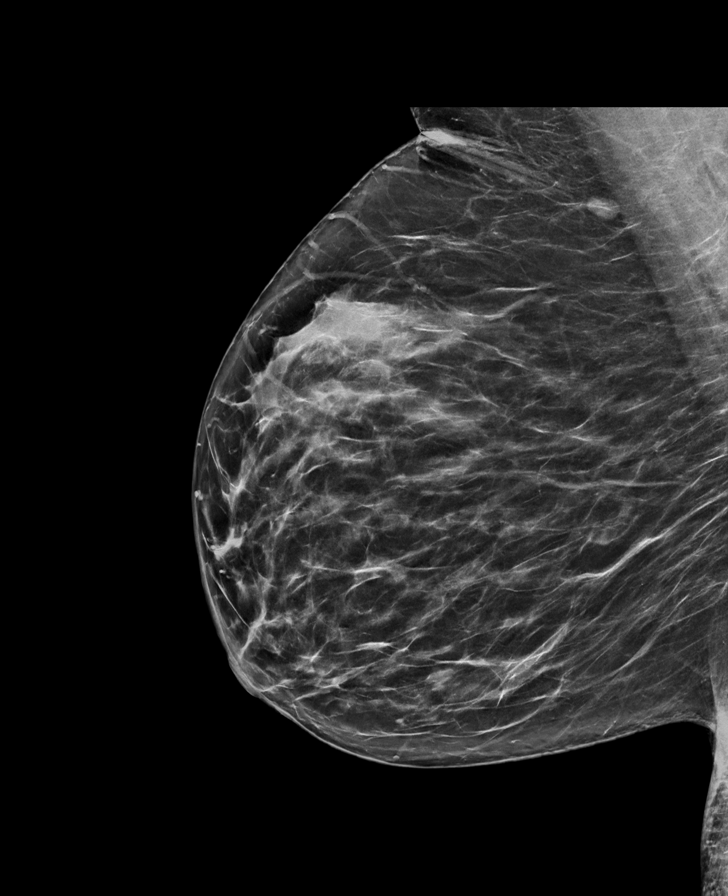

[L MLO synth-2D (2 of 4)]
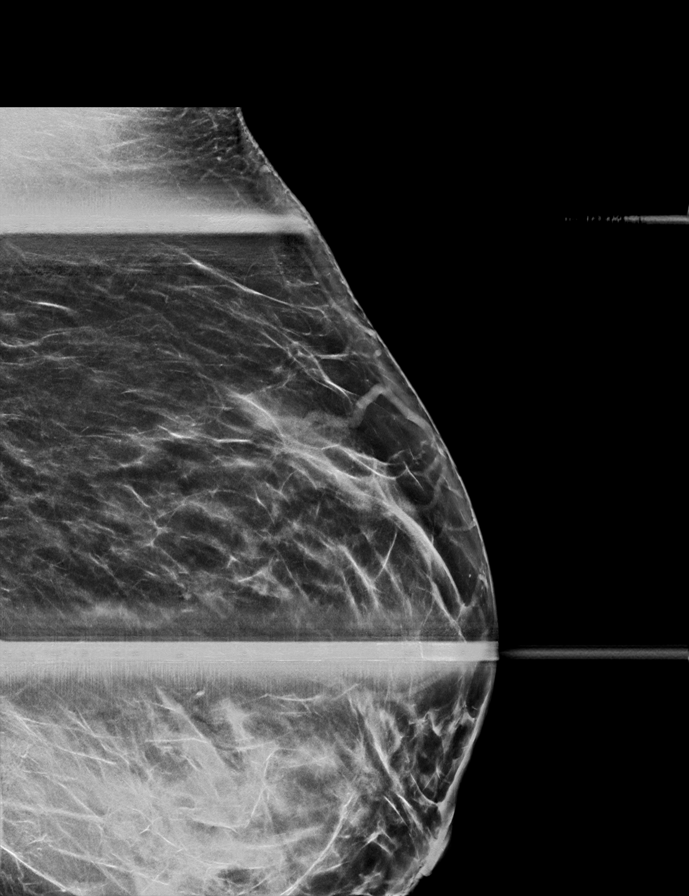

[L MLO synth-2D (3 of 4)]
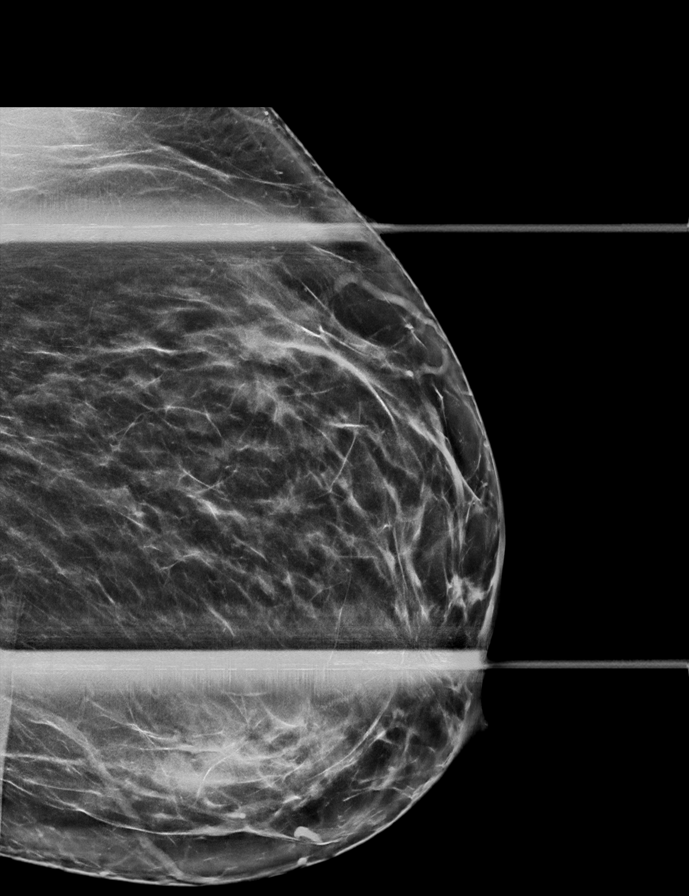

[L MLO synth-2D (4 of 4)]
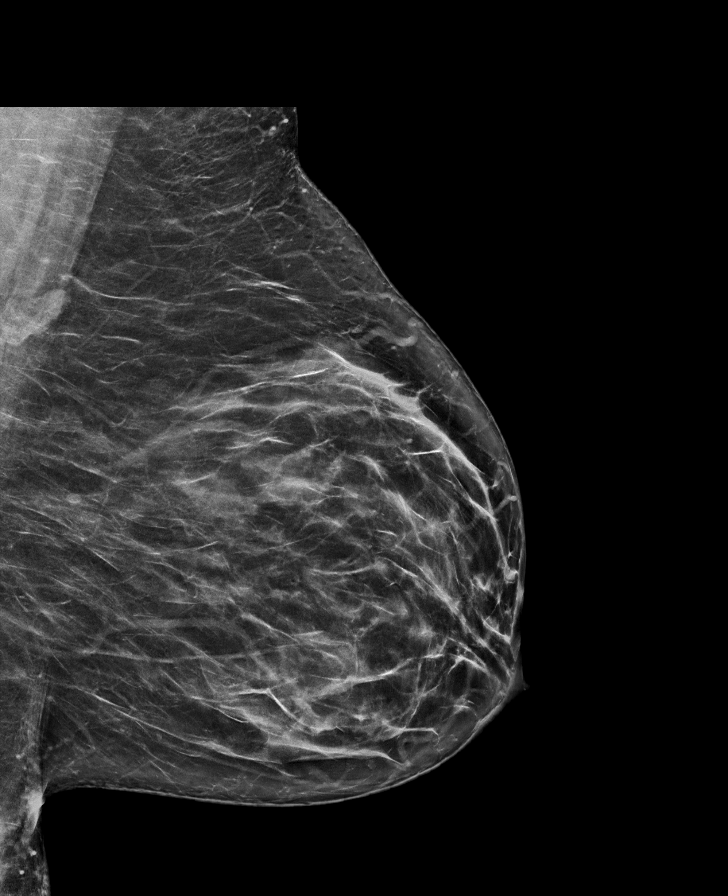

[L MLO tomo · tomo slice 45/66.0]
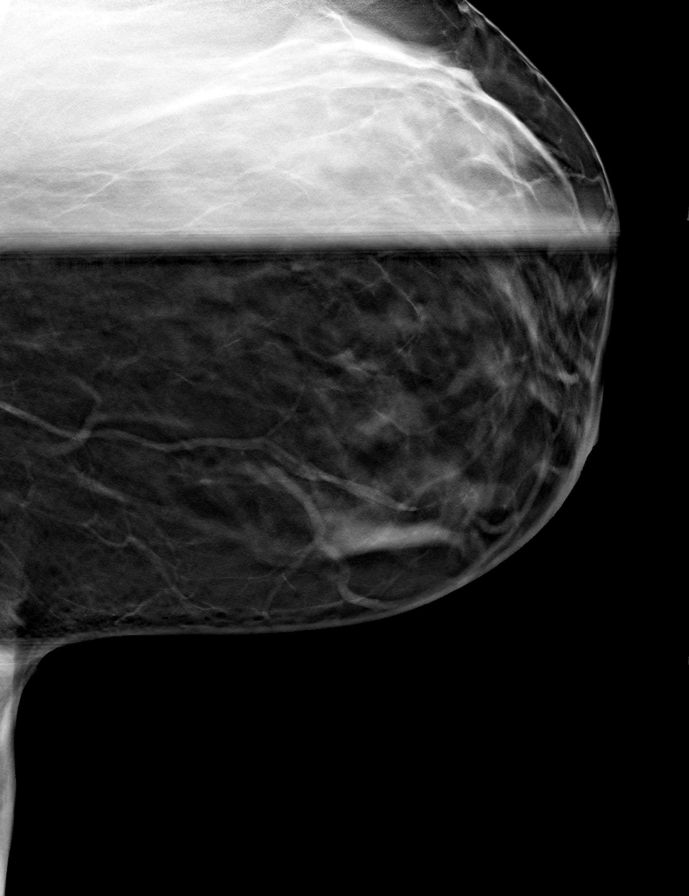

[8 of 40 positions shown; findings below may reference images not displayed]

ACR Breast Density Category b: There are scattered areas of
fibroglandular density.
FINDINGS: There is persistent asymmetry in the LOWER portion of the LEFT
breast, not associated with mass or distortion. The appearance is
stable compared with recent studies. RIGHT breast is negative.

Targeted ultrasound is performed, showing normal appearing
fibroglandular tissue throughout the LOWER OUTER QUADRANT of the
LEFT breast. No suspicious mass, distortion, or acoustic shadowing
is demonstrated with ultrasound.
IMPRESSION: Stable appearance of asymmetry in the LOWER portion of the LEFT
breast, consistent with benign breast parenchyma.

RECOMMENDATION:
Recommend bilateral diagnostic mammogram in 1 year to complete
follow-up.

I have discussed the findings and recommendations with the patient.
If applicable, a reminder letter will be sent to the patient
regarding the next appointment.

BI-RADS CATEGORY  1: Negative.

## 2022-03-29 ENCOUNTER — Encounter (HOSPITAL_COMMUNITY): Payer: Self-pay | Admitting: Emergency Medicine

## 2022-03-29 ENCOUNTER — Emergency Department (HOSPITAL_COMMUNITY)
Admission: EM | Admit: 2022-03-29 | Discharge: 2022-03-30 | Disposition: A | Discharge status: Home / Self Care | Payer: 59 | Attending: Emergency Medicine | Admitting: Emergency Medicine

## 2022-03-29 ENCOUNTER — Other Ambulatory Visit: Payer: Self-pay

## 2022-03-29 ENCOUNTER — Emergency Department (HOSPITAL_COMMUNITY): Payer: 59

## 2022-03-29 DIAGNOSIS — R1084 Generalized abdominal pain: Secondary | ICD-10-CM | POA: Insufficient documentation

## 2022-03-29 DIAGNOSIS — R1110 Vomiting, unspecified: Secondary | ICD-10-CM | POA: Diagnosis not present

## 2022-03-29 LAB — COMPREHENSIVE METABOLIC PANEL
ALT: 12 U/L (ref 0–44)
AST: 19 U/L (ref 15–41)
Albumin: 3.7 g/dL (ref 3.5–5.0)
Alkaline Phosphatase: 54 U/L (ref 38–126)
Anion gap: 9 (ref 5–15)
BUN: 10 mg/dL (ref 6–20)
CO2: 28 mmol/L (ref 22–32)
Calcium: 9.2 mg/dL (ref 8.9–10.3)
Chloride: 101 mmol/L (ref 98–111)
Creatinine, Ser: 0.92 mg/dL (ref 0.44–1.00)
GFR, Estimated: >60 mL/min (ref >60)
Glucose, Bld: 101 mg/dL — ABNORMAL HIGH (ref 70–99)
Potassium: 4.2 mmol/L (ref 3.5–5.1)
Sodium: 138 mmol/L (ref 135–145)
Total Bilirubin: 0.3 mg/dL (ref 0.3–1.2)
Total Protein: 6.4 g/dL — ABNORMAL LOW (ref 6.5–8.1)

## 2022-03-29 LAB — CBC WITH DIFFERENTIAL/PLATELET
Abs Immature Granulocytes: 0.03 10*3/uL (ref 0.00–0.07)
Basophils Absolute: 0.1 10*3/uL (ref 0.0–0.1)
Basophils Relative: 0 %
Eosinophils Absolute: 0.4 10*3/uL (ref 0.0–0.5)
Eosinophils Relative: 4 %
HCT: 38.3 % (ref 36.0–46.0)
Hemoglobin: 13 g/dL (ref 12.0–15.0)
Immature Granulocytes: 0 %
Lymphocytes Relative: 18 %
Lymphs Abs: 2 10*3/uL (ref 0.7–4.0)
MCH: 29.7 pg (ref 26.0–34.0)
MCHC: 33.9 g/dL (ref 30.0–36.0)
MCV: 87.4 fL (ref 80.0–100.0)
Monocytes Absolute: 1.1 10*3/uL — ABNORMAL HIGH (ref 0.1–1.0)
Monocytes Relative: 9 %
Neutro Abs: 7.7 10*3/uL (ref 1.7–7.7)
Neutrophils Relative %: 69 %
Platelets: 249 10*3/uL (ref 150–400)
RBC: 4.38 MIL/uL (ref 3.87–5.11)
RDW: 13.2 % (ref 11.5–15.5)
WBC: 11.3 10*3/uL — ABNORMAL HIGH (ref 4.0–10.5)
nRBC: 0 % (ref 0.0–0.2)

## 2022-03-29 LAB — URINALYSIS, MICROSCOPIC (REFLEX)

## 2022-03-29 LAB — URINALYSIS, ROUTINE W REFLEX MICROSCOPIC
Bilirubin Urine: NEGATIVE
Glucose, UA: NEGATIVE mg/dL
Ketones, ur: NEGATIVE mg/dL
Leukocytes,Ua: NEGATIVE
Nitrite: NEGATIVE
Protein, ur: NEGATIVE mg/dL
Specific Gravity, Urine: >1.03 — ABNORMAL HIGH (ref 1.005–1.030)
pH: 6 (ref 5.0–8.0)

## 2022-03-29 LAB — PREGNANCY, URINE: Preg Test, Ur: NEGATIVE

## 2022-03-29 LAB — LIPASE, BLOOD: Lipase: 39 U/L (ref 11–51)

## 2022-03-29 NOTE — ED Triage Notes (Signed)
Patient reports generalized abdominal pain with constipation , diarrhea and emesis onset last week .

## 2022-03-29 NOTE — ED Provider Triage Note (Signed)
Emergency Medicine Provider Triage Evaluation Note  [name] , a 47 y.o. female  was evaluated in triage.  Pt complains of abdominal pain and constipation.  Patient reports that she is currently taking Ozempic and has had an increase in the amount of abdominal pain and constipation has been feeling.  She is also had a few episodes of vomiting.  She reports that some of her stool has become loose but feels that she is still unable to fully void.  Denies any severe focal abdominal tenderness.  No fevers, chest pain, shortness of breath.  Reports that she is been on Ozempic for the last 6 months with no recent dose changes.  Does not believe that she could be pregnant at this time.  Review of Systems  Positive: As above Negative: As above  Physical Exam  LMP 03/21/2022  Gen:   Awake, no distress   Resp:  Normal effort  MSK:   Moves extremities without difficulty  Other:  No focal TTP  Medical Decision Making  Medically screening exam initiated at 7:42 PM.  Appropriate orders placed.  [name] was informed that the remainder of the evaluation will be completed by another provider, this initial triage assessment does not replace that evaluation, and the importance of remaining in the ED until their evaluation is complete.     Luvenia Heller, PA-C 03/29/22 1942

## 2022-03-30 NOTE — ED Provider Notes (Signed)
[facility] Provider Note   CSN: [id number] Arrival date & time: 03/29/22  [id number]     History  Chief Complaint  Patient presents with   Abdominal Pain    co   Constipation   Emesis    LESHELL [name] is a 47 y.o. female.  47 year old female presents with complaint of generalized abdominal pain onset 2 AM Monday morning, waking her from her sleep.  Notes that she had vomiting with several episodes of loose, nonbloody stools.  Feels that this is related to her Ozempic.  Went to urgent care on Tuesday, had an x-ray and was told she had an ileus with significantly abnormal blood work.  Patient was scheduled to have her labs rechecked however came to the ER today.  Notes that she is feeling little better today although has fecal urgency and feels like she must be constipated as she is only having minimal loose output.       Home Medications Prior to Admission medications   Medication Sig Start Date End Date Taking? Authorizing Provider  BIOTIN PO Take 1 capsule by mouth daily.    [provider]  Cyanocobalamin (VITAMIN B-12 PO) Take 1 tablet by mouth daily.    [provider]  cyclobenzaprine (FLEXERIL) 10 MG tablet Take 1 tablet (10 mg total) by mouth 2 (two) times daily as needed for muscle spasms. 06/06/19   Recardo Evangelist, PA-C  ERGOCALCIFEROL PO Take 1 tablet by mouth daily.    [provider]  folic acid (FOLVITE) 1 MG tablet Take 1 mg by mouth daily.    [provider]  Pyridoxine HCl (VITAMIN B-6 PO) Take 1 tablet by mouth daily.    [provider]  varenicline (CHANTIX STARTING MONTH PAK) 0.5 MG X 11 & 1 MG X 42 tablet Take by mouth as directed. 02/06/16   Hilty, Nadean Corwin, MD      Allergies    Other and Tape    Review of Systems   Review of Systems Negative except as per HPI Physical Exam Updated Vital Signs BP 126/72   Pulse 72   Temp 98.2 F (36.8 C) (Oral)   Resp 18    LMP 03/21/2022   SpO2 99%  Physical Exam Vitals and nursing note reviewed.  Constitutional:      General: She is not in acute distress.    Appearance: She is well-developed. She is not diaphoretic.  HENT:     Head: Normocephalic and atraumatic.  Cardiovascular:     Rate and Rhythm: Normal rate and regular rhythm.     Pulses: Normal pulses.     Heart sounds: Normal heart sounds.  Pulmonary:     Effort: Pulmonary effort is normal.     Breath sounds: Normal breath sounds.  Abdominal:     General: Bowel sounds are normal.     Palpations: Abdomen is soft.     Tenderness: There is no abdominal tenderness.  Skin:    General: Skin is warm and dry.  Neurological:     Mental Status: She is alert and oriented to person, place, and time.  Psychiatric:        Behavior: Behavior normal.     ED Results / Procedures / Treatments   Labs (all labs ordered are listed, but only abnormal results are displayed) Labs Reviewed  COMPREHENSIVE METABOLIC PANEL - Abnormal; Notable for the following components:      Result Value   Glucose,  Bld 101 (*)    Total Protein 6.4 (*)    All other components within normal limits  CBC WITH DIFFERENTIAL/PLATELET - Abnormal; Notable for the following components:   WBC 11.3 (*)    Monocytes Absolute 1.1 (*)    All other components within normal limits  URINALYSIS, ROUTINE W REFLEX MICROSCOPIC - Abnormal; Notable for the following components:   Specific Gravity, Urine >1.030 (*)    Hgb urine dipstick TRACE (*)    All other components within normal limits  URINALYSIS, MICROSCOPIC (REFLEX) - Abnormal; Notable for the following components:   Bacteria, UA RARE (*)    All other components within normal limits  LIPASE, BLOOD  PREGNANCY, URINE  POC URINE PREG, ED    EKG None  Radiology CT ABDOMEN PELVIS WO CONTRAST  Result Date: 03/30/2022 CLINICAL DATA:  Generalized abdominal pain with constipation, diarrhea and emesis since last week. Bowel obstruction  suspected. EXAM: CT ABDOMEN AND PELVIS WITHOUT CONTRAST TECHNIQUE: Multidetector CT imaging of the abdomen and pelvis was performed following the standard protocol without IV contrast. RADIATION DOSE REDUCTION: This exam was performed according to the departmental dose-optimization program which includes automated exposure control, adjustment of the mA and/or kV according to patient size and/or use of iterative reconstruction technique. COMPARISON:  Abdominal radiograph 03/27/2020 FINDINGS: Lower chest: No acute abnormality. Hepatobiliary: Cholecystectomy. Unremarkable liver and biliary tree. Pancreas: Unremarkable. Spleen: Unremarkable. Adrenals/Urinary Tract: Normal adrenal glands. No urinary calculi or hydronephrosis. Stomach/Bowel: Normal caliber large and small bowel. Colonic diverticulosis without diverticulitis. No bowel wall thickening. Normal appendix. Vascular/Lymphatic: Mild aortic atherosclerosis. No lymphadenopathy. Reproductive: Uterus and bilateral adnexa are unremarkable. Other: No free intraperitoneal fluid or air. Musculoskeletal: No acute or significant osseous findings. IMPRESSION: 1. No acute abdominopelvic process. 2. Colonic diverticulosis without diverticulitis. Electronically Signed   By: Placido Sou M.D.   On: 03/30/2022 00:06    Procedures Procedures    Medications Ordered in ED Medications - No data to display  ED Course/ Medical Decision Making/ A&P                             Medical Decision Making Amount and/or Complexity of Data Reviewed Labs: ordered.   This patient presents to the ED for concern of abdominal discomfort, loose stools, fecal urgency, this involves an extensive number of treatment options, and is a complaint that carries with it a high risk of complications and morbidity.  The differential diagnosis includes but not limited to bowel obstruction, metabolic abnormality, appendicitis, gastritis    Co morbidities that complicate the patient  evaluation  Prior cholecystectomy, C-section, BTL.   Additional history obtained:  External records from outside source obtained and reviewed including prior abdominal x-ray from urgent care this week, concerning for ileus versus SBO   Lab Tests:  I Ordered, and personally interpreted labs.  The pertinent results include: CBC with mild leukocytosis at 11.3.  CMP without significant findings.  Lipase within normal notes.  Urinalysis without significant findings.  hCG negative.   Imaging Studies ordered:  I ordered imaging studies including CT abdomen pelvis I independently visualized and interpreted imaging which showed no acute abnormality I agree with the radiologist interpretation   Problem List / ED Course / Critical interventions / Medication management  47 year old female presents with concern for abdominal discomfort with fecal urgency as above following GI illness earlier this week.  Seen at urgent care, told possible ileus versus SBO.  States that her  symptoms are improving although continues to have gas, somewhat improved with Gas-X, continues on MiraLAX.  Abdomen is soft nontender, bowel sounds normal.  Labs reassuring, CT abdomen pelvis without contrast shows diverticulosis without diverticulitis.  As patient symptoms are improving, recommend that she discontinue the MiraLAX, recheck with PCP and return to ER for worsening or concerning symptoms. I have reviewed the patients home medicines and have made adjustments as needed   Social Determinants of Health:  Has PCP   Test / Admission - Considered:  Stable for follow up with PCP         Final Clinical Impression(s) / ED Diagnoses Final diagnoses:  Generalized abdominal pain    Rx / DC Orders ED Discharge Orders     None         Tacy Learn, PA-C 03/30/22 0030    Ripley Fraise, MD 03/30/22 [phone]

## 2022-03-30 NOTE — Discharge Instructions (Signed)
Your workup today is reassuring. Discontinue MiraLAX. Recheck with your primary care provider as needed.  Return to ER for worsening or concerning symptoms.

## 2022-06-04 ENCOUNTER — Other Ambulatory Visit: Payer: Self-pay | Admitting: Obstetrics and Gynecology

## 2022-06-04 DIAGNOSIS — N6489 Other specified disorders of breast: Secondary | ICD-10-CM

## 2022-06-21 ENCOUNTER — Ambulatory Visit
Admission: RE | Admit: 2022-06-21 | Discharge: 2022-06-21 | Disposition: A | Discharge status: Home / Self Care | Payer: 59 | Source: Ambulatory Visit | Attending: Obstetrics and Gynecology | Admitting: Obstetrics and Gynecology

## 2022-06-21 DIAGNOSIS — N6489 Other specified disorders of breast: Secondary | ICD-10-CM

## 2023-07-08 ENCOUNTER — Other Ambulatory Visit: Payer: Self-pay | Admitting: Obstetrics and Gynecology

## 2023-07-08 DIAGNOSIS — Z1231 Encounter for screening mammogram for malignant neoplasm of breast: Secondary | ICD-10-CM

## 2023-07-14 LAB — COLOGUARD

## 2023-07-27 LAB — COLOGUARD: COLOGUARD: NEGATIVE

## 2023-08-01 ENCOUNTER — Ambulatory Visit
Admission: RE | Admit: 2023-08-01 | Discharge: 2023-08-01 | Disposition: A | Discharge status: Home / Self Care | Source: Ambulatory Visit | Attending: Obstetrics and Gynecology | Admitting: Obstetrics and Gynecology

## 2023-08-01 DIAGNOSIS — Z1231 Encounter for screening mammogram for malignant neoplasm of breast: Secondary | ICD-10-CM
